# Patient Record
Sex: Male | Born: 1950 | Race: White | Hispanic: No | Marital: Married
Health system: Southern US, Community
[De-identification: ages and names within clinical notes are randomized; demographics above are authoritative.]

## PROBLEM LIST (undated history)

## (undated) DIAGNOSIS — I639 Cerebral infarction, unspecified: Secondary | ICD-10-CM

## (undated) HISTORY — PX: NO PAST SURGERIES: SHX2092

## (undated) HISTORY — DX: Cerebral infarction, unspecified: I63.9

---

## 2014-10-03 ENCOUNTER — Other Ambulatory Visit: Payer: Self-pay

## 2015-08-21 ENCOUNTER — Other Ambulatory Visit: Payer: Self-pay | Admitting: Family Medicine

## 2015-08-22 LAB — CMP12+LP+TP+TSH+6AC+PSA+CBC…
ALT: 25 IU/L (ref 0–44)
AST: 18 IU/L (ref 0–40)
Albumin/Globulin Ratio: 1.7 (ref 1.2–2.2)
Albumin: 4.4 g/dL (ref 3.6–4.8)
Alkaline Phosphatase: 67 IU/L (ref 39–117)
BASOS ABS: 0 10*3/uL (ref 0.0–0.2)
BASOS: 0 %
BILIRUBIN TOTAL: 1.2 mg/dL (ref 0.0–1.2)
BUN/Creatinine Ratio: 16 (ref 10–24)
BUN: 10 mg/dL (ref 8–27)
CHLORIDE: 99 mmol/L (ref 96–106)
CHOL/HDL RATIO: 3.9 ratio (ref 0.0–5.0)
CREATININE: 0.63 mg/dL — AB (ref 0.76–1.27)
Calcium: 8.9 mg/dL (ref 8.6–10.2)
Cholesterol, Total: 167 mg/dL (ref 100–199)
EOS (ABSOLUTE): 0.1 10*3/uL (ref 0.0–0.4)
EOS: 2 %
ESTIMATED CHD RISK: 0.7 times avg. (ref 0.0–1.0)
Free Thyroxine Index: 1.5 (ref 1.2–4.9)
GFR calc Af Amer: 120 mL/min/{1.73_m2} (ref >59)
GFR calc non Af Amer: 104 mL/min/{1.73_m2} (ref >59)
GGT: 21 IU/L (ref 0–65)
GLUCOSE: 95 mg/dL (ref 65–99)
Globulin, Total: 2.6 g/dL (ref 1.5–4.5)
HDL: 43 mg/dL (ref >39)
HEMATOCRIT: 44.5 % (ref 37.5–51.0)
Hemoglobin: 14.7 g/dL (ref 12.6–17.7)
IMMATURE GRANULOCYTES: 0 %
IRON: 120 ug/dL (ref 38–169)
Immature Grans (Abs): 0 10*3/uL (ref 0.0–0.1)
LDH: 160 IU/L (ref 121–224)
LDL Calculated: 103 mg/dL — ABNORMAL HIGH (ref 0–99)
LYMPHS ABS: 2.4 10*3/uL (ref 0.7–3.1)
LYMPHS: 27 %
MCH: 30.2 pg (ref 26.6–33.0)
MCHC: 33 g/dL (ref 31.5–35.7)
MCV: 91 fL (ref 79–97)
MONOCYTES: 9 %
MONOS ABS: 0.8 10*3/uL (ref 0.1–0.9)
Neutrophils Absolute: 5.6 10*3/uL (ref 1.4–7.0)
Neutrophils: 62 %
PHOSPHORUS: 2.5 mg/dL (ref 2.5–4.5)
Platelets: 210 10*3/uL (ref 150–379)
Potassium: 4.3 mmol/L (ref 3.5–5.2)
Prostate Specific Ag, Serum: 1.4 ng/mL (ref 0.0–4.0)
RBC: 4.87 x10E6/uL (ref 4.14–5.80)
RDW: 13.4 % (ref 12.3–15.4)
Sodium: 139 mmol/L (ref 134–144)
T3 UPTAKE RATIO: 25 % (ref 24–39)
T4, Total: 6.1 ug/dL (ref 4.5–12.0)
TOTAL PROTEIN: 7 g/dL (ref 6.0–8.5)
TSH: 1.2 u[IU]/mL (ref 0.450–4.500)
Triglycerides: 106 mg/dL (ref 0–149)
URIC ACID: 6.1 mg/dL (ref 3.7–8.6)
VLDL CHOLESTEROL CAL: 21 mg/dL (ref 5–40)
WBC: 8.9 10*3/uL (ref 3.4–10.8)

## 2015-08-22 LAB — HGB A1C W/O EAG: Hgb A1c MFr Bld: 5.7 % — ABNORMAL HIGH (ref 4.8–5.6)

## 2016-08-07 ENCOUNTER — Ambulatory Visit: Payer: Self-pay | Admitting: *Deleted

## 2016-08-07 VITALS — BP 162/90 | HR 59 | Ht 71.0 in | Wt 256.0 lb

## 2016-08-07 DIAGNOSIS — Z0000 Encounter for general adult medical examination without abnormal findings: Secondary | ICD-10-CM

## 2016-08-07 NOTE — Progress Notes (Signed)
Be Well insurance premium discount evaluation: Labs Drawn. Replacements ROI form signed. Tobacco Free Attestation form signed.  Forms placed in paper chart.  Plan to recheck BP at review appt. No Hx HTN.  No PCP to route results to.

## 2016-08-08 LAB — CMP12+LP+TP+TSH+6AC+PSA+CBC…
A/G RATIO: 2 (ref 1.2–2.2)
ALBUMIN: 4.6 g/dL (ref 3.6–4.8)
ALT: 28 IU/L (ref 0–44)
AST: 18 IU/L (ref 0–40)
Alkaline Phosphatase: 63 IU/L (ref 39–117)
BASOS ABS: 0 10*3/uL (ref 0.0–0.2)
BUN / CREAT RATIO: 15 (ref 10–24)
BUN: 12 mg/dL (ref 8–27)
Basos: 0 %
Bilirubin Total: 1 mg/dL (ref 0.0–1.2)
CHOLESTEROL TOTAL: 147 mg/dL (ref 100–199)
CREATININE: 0.82 mg/dL (ref 0.76–1.27)
Calcium: 9.3 mg/dL (ref 8.6–10.2)
Chloride: 102 mmol/L (ref 96–106)
Chol/HDL Ratio: 3.6 ratio (ref 0.0–5.0)
EOS (ABSOLUTE): 0.2 10*3/uL (ref 0.0–0.4)
Eos: 2 %
Estimated CHD Risk: 0.6 times avg. (ref 0.0–1.0)
FREE THYROXINE INDEX: 1.9 (ref 1.2–4.9)
GFR calc Af Amer: 107 mL/min/{1.73_m2} (ref >59)
GFR calc non Af Amer: 93 mL/min/{1.73_m2} (ref >59)
GGT: 13 IU/L (ref 0–65)
GLOBULIN, TOTAL: 2.3 g/dL (ref 1.5–4.5)
Glucose: 104 mg/dL — ABNORMAL HIGH (ref 65–99)
HDL: 41 mg/dL (ref >39)
Hematocrit: 42.9 % (ref 37.5–51.0)
Hemoglobin: 14.7 g/dL (ref 13.0–17.7)
IMMATURE GRANULOCYTES: 0 %
IRON: 96 ug/dL (ref 38–169)
Immature Grans (Abs): 0 10*3/uL (ref 0.0–0.1)
LDH: 136 IU/L (ref 121–224)
LDL Calculated: 95 mg/dL (ref 0–99)
LYMPHS ABS: 2.5 10*3/uL (ref 0.7–3.1)
Lymphs: 31 %
MCH: 30.8 pg (ref 26.6–33.0)
MCHC: 34.3 g/dL (ref 31.5–35.7)
MCV: 90 fL (ref 79–97)
MONOCYTES: 7 %
Monocytes Absolute: 0.6 10*3/uL (ref 0.1–0.9)
NEUTROS PCT: 60 %
Neutrophils Absolute: 4.8 10*3/uL (ref 1.4–7.0)
PHOSPHORUS: 2.9 mg/dL (ref 2.5–4.5)
PLATELETS: 210 10*3/uL (ref 150–379)
PROSTATE SPECIFIC AG, SERUM: 1.5 ng/mL (ref 0.0–4.0)
Potassium: 4.9 mmol/L (ref 3.5–5.2)
RBC: 4.78 x10E6/uL (ref 4.14–5.80)
RDW: 13.5 % (ref 12.3–15.4)
Sodium: 141 mmol/L (ref 134–144)
T3 UPTAKE RATIO: 26 % (ref 24–39)
T4 TOTAL: 7.3 ug/dL (ref 4.5–12.0)
TOTAL PROTEIN: 6.9 g/dL (ref 6.0–8.5)
TRIGLYCERIDES: 54 mg/dL (ref 0–149)
TSH: 0.961 u[IU]/mL (ref 0.450–4.500)
Uric Acid: 5.1 mg/dL (ref 3.7–8.6)
VLDL CHOLESTEROL CAL: 11 mg/dL (ref 5–40)
WBC: 8.1 10*3/uL (ref 3.4–10.8)

## 2016-08-08 LAB — HGB A1C W/O EAG: Hgb A1c MFr Bld: 5.6 % (ref 4.8–5.6)

## 2016-08-08 NOTE — Progress Notes (Signed)
Results reviewed with pt. A1c slightly improved to WNL range. LDL improved to WNL range. All other labs unremarkable. BP rechecked today 180/100. Pt asymptomatic. Reports he took BP at home twice last night and both were normal ~130's. Requested he come back in one week for a recheck. If still elevated, will give him PCP options as he does not currently have one and does not wish to establish care at this time as he "is not sick." Language barrier prevents in depth conversation. Pt speaks some english, and is able repeat back that labs are normal and to come back to clinic for f/u in one week. Copy provided to pt. Reviewed recommendations for diet and exercise for general health and wt loss as BMI 35. Not further questions/concerns.

## 2016-08-19 ENCOUNTER — Ambulatory Visit: Payer: Self-pay | Admitting: *Deleted

## 2016-08-19 VITALS — BP 164/94

## 2016-08-19 DIAGNOSIS — R030 Elevated blood-pressure reading, without diagnosis of hypertension: Secondary | ICD-10-CM

## 2016-08-19 NOTE — Progress Notes (Signed)
BP recheck after Be Well eval found HTN. Still elevated today. Denies Hx of HTN or s/sx of same. Plans to repeat with RN next week. HTN sx, ER precautions and pcp f/u instructions given.

## 2017-07-23 ENCOUNTER — Telehealth: Payer: Self-pay | Admitting: Registered Nurse

## 2017-07-23 NOTE — Telephone Encounter (Signed)
Please verify with patient if he had annual labs and follow up with PCM due Jun 2019 none noted in Epic

## 2018-03-11 ENCOUNTER — Ambulatory Visit: Payer: Self-pay | Admitting: *Deleted

## 2018-03-11 VITALS — BP 156/93 | HR 65

## 2018-03-11 DIAGNOSIS — Z0000 Encounter for general adult medical examination without abnormal findings: Secondary | ICD-10-CM

## 2018-03-11 NOTE — Progress Notes (Signed)
Pt requests fasting labs be completed. No insurance through company this year, so no Be Well needed but wants labs still. NO pcp established but BP has been trending up for past 3-4 yrs per pt. He sts it is always higher here at work when checked. Encouraged pt to establish primary care for BP surveillance and general health maintenance.

## 2018-03-12 LAB — CMP12+LP+TP+TSH+6AC+PSA+CBC…
ALK PHOS: 81 IU/L (ref 39–117)
ALT: 22 IU/L (ref 0–44)
AST: 15 IU/L (ref 0–40)
Albumin/Globulin Ratio: 1.7 (ref 1.2–2.2)
Albumin: 4.5 g/dL (ref 3.8–4.8)
BILIRUBIN TOTAL: 0.9 mg/dL (ref 0.0–1.2)
BUN/Creatinine Ratio: 18 (ref 10–24)
BUN: 14 mg/dL (ref 8–27)
Basophils Absolute: 0 10*3/uL (ref 0.0–0.2)
Basos: 1 %
CALCIUM: 9.1 mg/dL (ref 8.6–10.2)
CHLORIDE: 99 mmol/L (ref 96–106)
CHOL/HDL RATIO: 4.4 ratio (ref 0.0–5.0)
CHOLESTEROL TOTAL: 174 mg/dL (ref 100–199)
Creatinine, Ser: 0.77 mg/dL (ref 0.76–1.27)
EOS (ABSOLUTE): 0.2 10*3/uL (ref 0.0–0.4)
Eos: 2 %
Estimated CHD Risk: 0.8 times avg. (ref 0.0–1.0)
FREE THYROXINE INDEX: 1.4 (ref 1.2–4.9)
GFR, EST AFRICAN AMERICAN: 109 mL/min/{1.73_m2} (ref >59)
GFR, EST NON AFRICAN AMERICAN: 94 mL/min/{1.73_m2} (ref >59)
GGT: 15 IU/L (ref 0–65)
GLOBULIN, TOTAL: 2.6 g/dL (ref 1.5–4.5)
Glucose: 88 mg/dL (ref 65–99)
HDL: 40 mg/dL (ref >39)
HEMATOCRIT: 42.6 % (ref 37.5–51.0)
HEMOGLOBIN: 14.7 g/dL (ref 13.0–17.7)
IMMATURE GRANS (ABS): 0 10*3/uL (ref 0.0–0.1)
IMMATURE GRANULOCYTES: 0 %
Iron: 95 ug/dL (ref 38–169)
LDH: 172 IU/L (ref 121–224)
LDL CALC: 115 mg/dL — AB (ref 0–99)
LYMPHS: 28 %
Lymphocytes Absolute: 2.4 10*3/uL (ref 0.7–3.1)
MCH: 30.6 pg (ref 26.6–33.0)
MCHC: 34.5 g/dL (ref 31.5–35.7)
MCV: 89 fL (ref 79–97)
MONOS ABS: 0.8 10*3/uL (ref 0.1–0.9)
Monocytes: 9 %
NEUTROS PCT: 60 %
Neutrophils Absolute: 5.4 10*3/uL (ref 1.4–7.0)
PROSTATE SPECIFIC AG, SERUM: 1.8 ng/mL (ref 0.0–4.0)
Phosphorus: 3.2 mg/dL (ref 2.8–4.1)
Platelets: 217 10*3/uL (ref 150–450)
Potassium: 4.2 mmol/L (ref 3.5–5.2)
RBC: 4.81 x10E6/uL (ref 4.14–5.80)
RDW: 12.6 % (ref 11.6–15.4)
Sodium: 138 mmol/L (ref 134–144)
T3 Uptake Ratio: 21 % — ABNORMAL LOW (ref 24–39)
T4, Total: 6.7 ug/dL (ref 4.5–12.0)
TRIGLYCERIDES: 96 mg/dL (ref 0–149)
TSH: 1.3 u[IU]/mL (ref 0.450–4.500)
Total Protein: 7.1 g/dL (ref 6.0–8.5)
Uric Acid: 5.6 mg/dL (ref 3.7–8.6)
VLDL CHOLESTEROL CAL: 19 mg/dL (ref 5–40)
WBC: 8.8 10*3/uL (ref 3.4–10.8)

## 2018-03-12 LAB — HGB A1C W/O EAG: Hgb A1c MFr Bld: 5.7 % — ABNORMAL HIGH (ref 4.8–5.6)

## 2018-03-15 NOTE — Progress Notes (Signed)
noted 

## 2019-03-24 ENCOUNTER — Ambulatory Visit: Payer: Self-pay | Attending: Internal Medicine

## 2019-03-24 DIAGNOSIS — Z23 Encounter for immunization: Secondary | ICD-10-CM

## 2019-03-24 NOTE — Progress Notes (Signed)
   Covid-19 Vaccination Clinic  Name:  Randy Robertson    MRN: [id number] DOB: 02/22/1952  03/24/2019  [name] was observed post Covid-19 immunization for 15 minutes without incident. He was provided with Vaccine Information Sheet and instruction to access the V-Safe system.   [name] was instructed to call 911 with any severe reactions post vaccine: Marland Kitchen Difficulty breathing  . Swelling of face and throat  . A fast heartbeat  . A bad rash all over body  . Dizziness and weakness   Immunizations Administered    Name Date Dose VIS Date Route   Pfizer COVID-19 Vaccine 03/24/2019 10:55 AM 0.3 mL 12/17/2018 Intramuscular   Manufacturer: [employer], [employer]   Lot: [id number]   NDC: [address]-1

## 2019-04-19 ENCOUNTER — Ambulatory Visit: Payer: Self-pay | Attending: Internal Medicine

## 2019-04-19 DIAGNOSIS — Z23 Encounter for immunization: Secondary | ICD-10-CM

## 2019-04-19 NOTE — Progress Notes (Signed)
   Covid-19 Vaccination Clinic  Name:  Randy Robertson    MRN: [id number] DOB: 1950-12-14  04/19/2019  [name] was observed post Covid-19 immunization for 15 minutes without incident. He was provided with Vaccine Information Sheet and instruction to access the V-Safe system.   [name] was instructed to call 911 with any severe reactions post vaccine: Marland Kitchen Difficulty breathing  . Swelling of face and throat  . A fast heartbeat  . A bad rash all over body  . Dizziness and weakness   Immunizations Administered    Name Date Dose VIS Date Route   Pfizer COVID-19 Vaccine 04/19/2019 10:11 AM 0.3 mL 12/17/2018 Intramuscular   Manufacturer: [employer], [employer]   Lot: [id number]   NDC: [address]-1

## 2020-01-30 ENCOUNTER — Ambulatory Visit: Payer: Self-pay

## 2020-02-03 ENCOUNTER — Other Ambulatory Visit (HOSPITAL_BASED_OUTPATIENT_CLINIC_OR_DEPARTMENT_OTHER): Payer: Self-pay | Admitting: Internal Medicine

## 2020-02-03 ENCOUNTER — Ambulatory Visit: Payer: Self-pay | Attending: Internal Medicine

## 2020-02-03 DIAGNOSIS — Z23 Encounter for immunization: Secondary | ICD-10-CM

## 2020-02-03 MED FILL — PFIZER-BIONTECH COVID-19 VA: 30 | 21 days supply | Qty: 0 | Fill #0

## 2020-02-03 NOTE — Progress Notes (Signed)
   Covid-19 Vaccination Clinic  Name:  Randy Robertson    MRN: [id number] DOB: 1950-10-15  02/03/2020  Randy Robertson was observed post Covid-19 immunization for 15 minutes without incident. He was provided with Vaccine Information Sheet and instruction to access the V-Safe system.   Randy Robertson was instructed to call 911 with any severe reactions post vaccine: Marland Kitchen Difficulty breathing  . Swelling of face and throat  . A fast heartbeat  . A bad rash all over body  . Dizziness and weakness   Immunizations Administered    Name Date Dose VIS Date Route   Pfizer COVID-19 Vaccine 02/03/2020 10:51 AM 0.3 mL 10/26/2019 Intramuscular   Manufacturer: [employer], [employer]   Lot: [id number]   NDC: [address]-3

## 2020-04-26 ENCOUNTER — Emergency Department (HOSPITAL_BASED_OUTPATIENT_CLINIC_OR_DEPARTMENT_OTHER): Payer: Medicare HMO

## 2020-04-26 ENCOUNTER — Other Ambulatory Visit: Payer: Self-pay

## 2020-04-26 ENCOUNTER — Inpatient Hospital Stay (HOSPITAL_BASED_OUTPATIENT_CLINIC_OR_DEPARTMENT_OTHER)
Admission: EM | Admit: 2020-04-26 | Discharge: 2020-04-27 | DRG: 066 | Disposition: A | Discharge status: Home / Self Care | Payer: Medicare HMO | Attending: Internal Medicine | Admitting: Internal Medicine

## 2020-04-26 DIAGNOSIS — Z8616 Personal history of COVID-19: Secondary | ICD-10-CM | POA: Diagnosis not present

## 2020-04-26 DIAGNOSIS — R202 Paresthesia of skin: Secondary | ICD-10-CM

## 2020-04-26 DIAGNOSIS — I639 Cerebral infarction, unspecified: Secondary | ICD-10-CM | POA: Diagnosis not present

## 2020-04-26 DIAGNOSIS — R29700 NIHSS score 0: Secondary | ICD-10-CM | POA: Diagnosis not present

## 2020-04-26 DIAGNOSIS — I6389 Other cerebral infarction: Secondary | ICD-10-CM | POA: Diagnosis not present

## 2020-04-26 DIAGNOSIS — R2681 Unsteadiness on feet: Secondary | ICD-10-CM | POA: Diagnosis present

## 2020-04-26 DIAGNOSIS — R7303 Prediabetes: Secondary | ICD-10-CM | POA: Diagnosis present

## 2020-04-26 DIAGNOSIS — Z6836 Body mass index (BMI) 36.0-36.9, adult: Secondary | ICD-10-CM

## 2020-04-26 DIAGNOSIS — E785 Hyperlipidemia, unspecified: Secondary | ICD-10-CM | POA: Diagnosis present

## 2020-04-26 DIAGNOSIS — E669 Obesity, unspecified: Secondary | ICD-10-CM | POA: Diagnosis present

## 2020-04-26 DIAGNOSIS — R030 Elevated blood-pressure reading, without diagnosis of hypertension: Secondary | ICD-10-CM | POA: Diagnosis present

## 2020-04-26 DIAGNOSIS — R200 Anesthesia of skin: Secondary | ICD-10-CM | POA: Diagnosis not present

## 2020-04-26 DIAGNOSIS — H538 Other visual disturbances: Secondary | ICD-10-CM | POA: Diagnosis present

## 2020-04-26 DIAGNOSIS — I63311 Cerebral infarction due to thrombosis of right middle cerebral artery: Secondary | ICD-10-CM

## 2020-04-26 DIAGNOSIS — Z20822 Contact with and (suspected) exposure to covid-19: Secondary | ICD-10-CM | POA: Diagnosis not present

## 2020-04-26 DIAGNOSIS — I63511 Cerebral infarction due to unspecified occlusion or stenosis of right middle cerebral artery: Principal | ICD-10-CM | POA: Diagnosis present

## 2020-04-26 DIAGNOSIS — I10 Essential (primary) hypertension: Secondary | ICD-10-CM | POA: Diagnosis not present

## 2020-04-26 DIAGNOSIS — I63411 Cerebral infarction due to embolism of right middle cerebral artery: Secondary | ICD-10-CM | POA: Diagnosis not present

## 2020-04-26 LAB — URINALYSIS, ROUTINE W REFLEX MICROSCOPIC
Bilirubin Urine: NEGATIVE
Glucose, UA: NEGATIVE mg/dL
Hgb urine dipstick: NEGATIVE
Ketones, ur: NEGATIVE mg/dL
Leukocytes,Ua: NEGATIVE
Nitrite: NEGATIVE
Protein, ur: NEGATIVE mg/dL
Specific Gravity, Urine: <1.005 — ABNORMAL LOW (ref 1.005–1.030)
pH: 7 (ref 5.0–8.0)

## 2020-04-26 LAB — RESP PANEL BY RT-PCR (FLU A&B, COVID) ARPGX2
Influenza A by PCR: NEGATIVE
Influenza B by PCR: NEGATIVE
SARS Coronavirus 2 by RT PCR: NEGATIVE

## 2020-04-26 LAB — COMPREHENSIVE METABOLIC PANEL
ALT: 39 U/L (ref 0–44)
AST: 30 U/L (ref 15–41)
Albumin: 4.4 g/dL (ref 3.5–5.0)
Alkaline Phosphatase: 60 U/L (ref 38–126)
Anion gap: 10 (ref 5–15)
BUN: 10 mg/dL (ref 8–23)
CO2: 26 mmol/L (ref 22–32)
Calcium: 8.9 mg/dL (ref 8.9–10.3)
Chloride: 100 mmol/L (ref 98–111)
Creatinine, Ser: 0.72 mg/dL (ref 0.61–1.24)
GFR, Estimated: >60 mL/min (ref >60)
Glucose, Bld: 96 mg/dL (ref 70–99)
Potassium: 3.7 mmol/L (ref 3.5–5.1)
Sodium: 136 mmol/L (ref 135–145)
Total Bilirubin: 0.9 mg/dL (ref 0.3–1.2)
Total Protein: 7.4 g/dL (ref 6.5–8.1)

## 2020-04-26 LAB — CBC
HCT: 43.2 % (ref 39.0–52.0)
Hemoglobin: 15 g/dL (ref 13.0–17.0)
MCH: 31 pg (ref 26.0–34.0)
MCHC: 34.7 g/dL (ref 30.0–36.0)
MCV: 89.3 fL (ref 80.0–100.0)
Platelets: 219 10*3/uL (ref 150–400)
RBC: 4.84 MIL/uL (ref 4.22–5.81)
RDW: 12.4 % (ref 11.5–15.5)
WBC: 10.5 10*3/uL (ref 4.0–10.5)
nRBC: 0 % (ref 0.0–0.2)

## 2020-04-26 LAB — DIFFERENTIAL
Abs Immature Granulocytes: 0.04 10*3/uL (ref 0.00–0.07)
Basophils Absolute: 0 10*3/uL (ref 0.0–0.1)
Basophils Relative: 0 %
Eosinophils Absolute: 0.1 10*3/uL (ref 0.0–0.5)
Eosinophils Relative: 1 %
Immature Granulocytes: 0 %
Lymphocytes Relative: 24 %
Lymphs Abs: 2.5 10*3/uL (ref 0.7–4.0)
Monocytes Absolute: 1 10*3/uL (ref 0.1–1.0)
Monocytes Relative: 9 %
Neutro Abs: 6.9 10*3/uL (ref 1.7–7.7)
Neutrophils Relative %: 66 %

## 2020-04-26 LAB — RAPID URINE DRUG SCREEN, HOSP PERFORMED
Amphetamines: NOT DETECTED
Barbiturates: NOT DETECTED
Benzodiazepines: NOT DETECTED
Cocaine: NOT DETECTED
Opiates: NOT DETECTED
Tetrahydrocannabinol: NOT DETECTED

## 2020-04-26 LAB — APTT: aPTT: 27 seconds (ref 24–36)

## 2020-04-26 LAB — ETHANOL: Alcohol, Ethyl (B): <10 mg/dL (ref <10)

## 2020-04-26 LAB — PROTIME-INR
INR: 1 (ref 0.8–1.2)
Prothrombin Time: 13 seconds (ref 11.4–15.2)

## 2020-04-26 MED ORDER — IOHEXOL 350 MG/ML SOLN
100.0000 mL | Freq: Once | INTRAVENOUS | Status: AC
  Administered 2020-04-26: 100 mL via INTRAVENOUS

## 2020-04-26 NOTE — ED Notes (Signed)
Urine obtained, family at bedside, no c/o pain, vss Green top tube re-drawn and sent to lab

## 2020-04-26 NOTE — ED Notes (Signed)
REPORT CALLED TO [facility] CHARGE NURSE CALLIE REPORT GIVEN TO HAZEL WITH Melburn Hake

## 2020-04-26 NOTE — ED Triage Notes (Signed)
Daughter reports c/o left arm numbness from elbow to left fingers starting this am at 0830.blurred vision at 3 pm.  Daughter also reports unstable gait today, last known normal . Unknown. , pt denies h/a

## 2020-04-26 NOTE — ED Notes (Signed)
Family states that approx 0800hrs today pt began to complain of numbness in his left arm, felt like his balance was off, also states he is having urinary frequency, denies ever having a HA or vision issues.

## 2020-04-26 NOTE — ED Notes (Signed)
PT LAST NORMAL 0800HRS TODAY

## 2020-04-26 NOTE — ED Triage Notes (Signed)
Coming from [facility] ED for MRI after evaluated for possible stroke.

## 2020-04-26 NOTE — ED Notes (Signed)
ED Provider at bedside. 

## 2020-04-26 NOTE — ED Provider Notes (Signed)
[facility] EMERGENCY DEPARTMENT Provider Note   CSN: [id number] Arrival date & time: 04/26/20  1831     History Chief Complaint  Patient presents with  . Numbness    Randy Robertson is a 70 y.o. male.  He is [place] and daughter is providing history.  He woke up with numbness left arm from his elbow to his fingers.  He has felt somewhat unsteady with his gait since then.  At 3 PM he started noticing he was also having some blurred vision.  The history is provided by the patient and a relative. The history is limited by a language barrier. A language interpreter was used (daughter at patient request).  Cerebrovascular Accident This is a new problem. The current episode started 12 to 24 hours ago. The problem occurs constantly. The problem has not changed since onset.Pertinent negatives include no chest pain, no abdominal pain, no headaches and no shortness of breath. Nothing aggravates the symptoms. Nothing relieves the symptoms. He has tried nothing for the symptoms. The treatment provided no relief.       History reviewed. No pertinent past medical history.  Patient Active Problem List   Diagnosis Date Noted  . Stroke (HCC) 04/27/2020  . Elevated blood-pressure reading without diagnosis of hypertension 04/27/2020    Past Surgical History:  Procedure Laterality Date  . NO PAST SURGERIES         History reviewed. No pertinent family history.     Home Medications Prior to Admission medications   Not on File    Allergies    Patient has no known allergies.  Review of Systems   Review of Systems  Constitutional: Negative for fever.  HENT: Negative for sore throat.   Eyes: Positive for visual disturbance.  Respiratory: Negative for shortness of breath.   Cardiovascular: Negative for chest pain.  Gastrointestinal: Negative for abdominal pain.  Genitourinary: Positive for frequency. Negative for dysuria.  Musculoskeletal: Positive for gait  problem. Negative for neck pain.  Skin: Negative for rash.  Neurological: Positive for weakness and numbness. Negative for speech difficulty and headaches.    Physical Exam Updated Vital Signs BP (!) 171/78 (BP Location: Right Arm)   Pulse 77   Temp 98.6 F (37 C) (Oral)   Resp 20   Wt 117.9 kg   SpO2 96%   BMI 36.26 kg/m   Physical Exam Vitals and nursing note reviewed.  Constitutional:      Appearance: Normal appearance. He is well-developed.  HENT:     Head: Normocephalic and atraumatic.  Eyes:     Conjunctiva/sclera: Conjunctivae normal.  Cardiovascular:     Rate and Rhythm: Normal rate and regular rhythm.     Heart sounds: No murmur Robertson.   Pulmonary:     Effort: Pulmonary effort is normal. No respiratory distress.     Breath sounds: Normal breath sounds.  Abdominal:     Palpations: Abdomen is soft.     Tenderness: There is no abdominal tenderness. There is no guarding or rebound.  Musculoskeletal:        General: No deformity or signs of injury. Normal range of motion.     Cervical back: Neck supple.  Skin:    General: Skin is warm and dry.  Neurological:     Mental Status: He is alert.     Cranial Nerves: No cranial nerve deficit.     Sensory: Sensory deficit present.     Motor: No weakness.     Comments: No  obvious facial asymmetry and clear speech.  Subjective decrease sensation and some subtle weakness in his left arm.  Normal strength lower extremities.  Gait deferred.     ED Results / Procedures / Treatments   Labs (all labs ordered are listed, but only abnormal results are displayed) Labs Reviewed  URINALYSIS, ROUTINE W REFLEX MICROSCOPIC - Abnormal; Notable for the following components:      Result Value   Specific Gravity, Urine <1.005 (*)    All other components within normal limits  LIPID PANEL - Abnormal; Notable for the following components:   LDL Cholesterol 132 (*)    All other components within normal limits  RESP PANEL BY RT-PCR  (FLU A&B, COVID) ARPGX2  SARS CORONAVIRUS 2 (TAT 6-24 HRS)  ETHANOL  PROTIME-INR  APTT  CBC  DIFFERENTIAL  COMPREHENSIVE METABOLIC PANEL  RAPID URINE DRUG SCREEN, HOSP PERFORMED  HIV ANTIBODY (ROUTINE TESTING W REFLEX)  HEMOGLOBIN A1C    EKG None  Radiology CT Angio Head W/Cm &/Or Wo Cm  Result Date: 04/26/2020 CLINICAL DATA:  Neuro deficit, acute, stroke suspected. Additional history provided: Patient reports left arm numbness from elbow to left fingers starting this morning, blurred vision, patient's daughter reports unstable gait, unknown last normal. EXAM: CT ANGIOGRAPHY HEAD AND NECK TECHNIQUE: Multidetector CT imaging of the head and neck was performed using the standard protocol during bolus administration of intravenous contrast. Multiplanar CT image reconstructions and MIPs were obtained to evaluate the vascular anatomy. Carotid stenosis measurements (when applicable) are obtained utilizing NASCET criteria, using the distal internal carotid diameter as the denominator. CONTRAST:  OMNIPAQUE IOHEXOL 350 MG/ML SOLN COMPARISON:  No pertinent prior exams available for comparison. FINDINGS: CT HEAD FINDINGS Brain: Mild cerebral atrophy. Mild ill-defined hypoattenuation within the cerebral white matter is nonspecific, but compatible with chronic small vessel ischemic disease. A small acute cortical/subcortical infarct (measuring approximately 10 mm) is questioned within the mid to posterior right frontal lobe (for instance as seen on series 4, image 25) (series 8, images 49 and 50). There is no acute intracranial hemorrhage. No extra-axial fluid collection. No evidence of intracranial mass. No midline shift. Vascular: No hyperdense vessel.  Atherosclerotic calcifications Skull: Normal. Negative for fracture or focal lesion. Sinuses: No significant paranasal sinus disease at the imaged levels. Orbits: No mass or acute finding Review of the MIP images confirms the above findings CTA NECK  FINDINGS Aortic arch: Standard aortic branching. Atherosclerotic plaque within the visualized aortic arch and proximal major branch vessels of the neck. No hemodynamically significant innominate or proximal subclavian artery stenosis. Right carotid system: CCA and ICA patent within the neck. Mild soft and calcified plaque within the carotid bifurcation and proximal ICA. Resultant less than 50% stenosis within the proximal ICA. Tortuosity of the cervical ICA. Left carotid system: CCA and ICA patent within the neck without stenosis. Mild soft and calcified plaque within the carotid bifurcation and proximal ICA. Vertebral arteries: Right vertebral artery slightly dominant. Mild nonstenotic calcified plaque at the origin of the right vertebral artery. Mild atheromatous narrowing at the origin of the left vertebral artery. Skeleton: Cervical spondylosis. No acute bony abnormality or aggressive osseous lesion. Other neck: No neck mass or cervical lymphadenopathy. Thyroid unremarkable. Upper chest: No consolidation within the imaged lung apices. Review of the MIP images confirms the above findings CTA HEAD FINDINGS Anterior circulation: The intracranial internal carotid arteries are patent. The M1 middle cerebral arteries are patent. No M2 proximal branch occlusion or high-grade proximal stenosis is identified. The  A1 left anterior cerebral artery is developmentally hypoplastic or absent. The anterior cerebral arteries are otherwise patent. No intracranial aneurysm is identified. Posterior circulation: The intracranial vertebral arteries are patent. The basilar artery is patent. The posterior cerebral arteries are patent. Atherosclerotic irregularity of the posterior cerebral arteries bilaterally. Most notably, there is a severe stenosis within the proximal left PCA at the P1/P2 junction (series 15, image 53). Posterior communicating arteries are hypoplastic or absent bilaterally. Venous sinuses: Within the limitations of  contrast timing, no convincing thrombus. Anatomic variants: Posterior communicating arteries are hypoplastic or absent bilaterally. Review of the MIP images confirms the above findings IMPRESSION: CT head: 1. Small acute cortically-based infarct questioned within the mid to posterior right frontal lobe (measuring approximately 10 mm). Consider brain MRI for further evaluation. 2. Mild cerebral atrophy and chronic small vessel ischemic disease. CTA neck: 1. The common carotid and internal carotid arteries are patent within the neck without hemodynamically significant stenosis. Mild atherosclerotic plaque within the carotid bifurcations and proximal ICAs, bilaterally. 2. Vertebral arteries patent within the neck. Mild atherosclerotic narrowing at the origin of both vessels. CTA head: 1. No intracranial large vessel occlusion. 2. 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 3. Calcified plaque within the intracranial internal carotid arteries with sites of mild stenosis, bilaterally. Electronically Signed   By: Jackey Loge DO   On: 04/26/2020 20:42   CT Angio Neck W and/or Wo Contrast  Result Date: 04/26/2020 CLINICAL DATA:  Neuro deficit, acute, stroke suspected. Additional history provided: Patient reports left arm numbness from elbow to left fingers starting this morning, blurred vision, patient's daughter reports unstable gait, unknown last normal. EXAM: CT ANGIOGRAPHY HEAD AND NECK TECHNIQUE: Multidetector CT imaging of the head and neck was performed using the standard protocol during bolus administration of intravenous contrast. Multiplanar CT image reconstructions and MIPs were obtained to evaluate the vascular anatomy. Carotid stenosis measurements (when applicable) are obtained utilizing NASCET criteria, using the distal internal carotid diameter as the denominator. CONTRAST:  OMNIPAQUE IOHEXOL 350 MG/ML SOLN COMPARISON:  No pertinent prior exams  available for comparison. FINDINGS: CT HEAD FINDINGS Brain: Mild cerebral atrophy. Mild ill-defined hypoattenuation within the cerebral white matter is nonspecific, but compatible with chronic small vessel ischemic disease. A small acute cortical/subcortical infarct (measuring approximately 10 mm) is questioned within the mid to posterior right frontal lobe (for instance as seen on series 4, image 25) (series 8, images 49 and 50). There is no acute intracranial hemorrhage. No extra-axial fluid collection. No evidence of intracranial mass. No midline shift. Vascular: No hyperdense vessel.  Atherosclerotic calcifications Skull: Normal. Negative for fracture or focal lesion. Sinuses: No significant paranasal sinus disease at the imaged levels. Orbits: No mass or acute finding Review of the MIP images confirms the above findings CTA NECK FINDINGS Aortic arch: Standard aortic branching. Atherosclerotic plaque within the visualized aortic arch and proximal major branch vessels of the neck. No hemodynamically significant innominate or proximal subclavian artery stenosis. Right carotid system: CCA and ICA patent within the neck. Mild soft and calcified plaque within the carotid bifurcation and proximal ICA. Resultant less than 50% stenosis within the proximal ICA. Tortuosity of the cervical ICA. Left carotid system: CCA and ICA patent within the neck without stenosis. Mild soft and calcified plaque within the carotid bifurcation and proximal ICA. Vertebral arteries: Right vertebral artery slightly dominant. Mild nonstenotic calcified plaque at the origin of the right vertebral artery. Mild atheromatous narrowing at the  origin of the left vertebral artery. Skeleton: Cervical spondylosis. No acute bony abnormality or aggressive osseous lesion. Other neck: No neck mass or cervical lymphadenopathy. Thyroid unremarkable. Upper chest: No consolidation within the imaged lung apices. Review of the MIP images confirms the above  findings CTA HEAD FINDINGS Anterior circulation: The intracranial internal carotid arteries are patent. The M1 middle cerebral arteries are patent. No M2 proximal branch occlusion or high-grade proximal stenosis is identified. The A1 left anterior cerebral artery is developmentally hypoplastic or absent. The anterior cerebral arteries are otherwise patent. No intracranial aneurysm is identified. Posterior circulation: The intracranial vertebral arteries are patent. The basilar artery is patent. The posterior cerebral arteries are patent. Atherosclerotic irregularity of the posterior cerebral arteries bilaterally. Most notably, there is a severe stenosis within the proximal left PCA at the P1/P2 junction (series 15, image 53). Posterior communicating arteries are hypoplastic or absent bilaterally. Venous sinuses: Within the limitations of contrast timing, no convincing thrombus. Anatomic variants: Posterior communicating arteries are hypoplastic or absent bilaterally. Review of the MIP images confirms the above findings IMPRESSION: CT head: 1. Small acute cortically-based infarct questioned within the mid to posterior right frontal lobe (measuring approximately 10 mm). Consider brain MRI for further evaluation. 2. Mild cerebral atrophy and chronic small vessel ischemic disease. CTA neck: 1. The common carotid and internal carotid arteries are patent within the neck without hemodynamically significant stenosis. Mild atherosclerotic plaque within the carotid bifurcations and proximal ICAs, bilaterally. 2. Vertebral arteries patent within the neck. Mild atherosclerotic narrowing at the origin of both vessels. CTA head: 1. No intracranial large vessel occlusion. 2. 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 3. Calcified plaque within the intracranial internal carotid arteries with sites of mild stenosis, bilaterally. Electronically Signed   By: Jackey Loge DO   On:  04/26/2020 20:42   MR BRAIN WO CONTRAST  Result Date: 04/27/2020 CLINICAL DATA:  Left arm numbness EXAM: MRI HEAD WITHOUT CONTRAST TECHNIQUE: Multiplanar, multiecho pulse sequences of the brain and surrounding structures were obtained without intravenous contrast. COMPARISON:  None. FINDINGS: Brain: Multifocal abnormal diffusion restriction within the right MCA territory. No acute or chronic hemorrhage. There is multifocal hyperintense T2-weighted signal within the white matter. Parenchymal volume and CSF spaces are normal. The midline structures are normal. Vascular: Major flow voids are preserved. Skull and upper cervical spine: Normal calvarium and skull base. Visualized upper cervical spine and soft tissues are normal. Sinuses/Orbits:No paranasal sinus fluid levels or advanced mucosal thickening. No mastoid or middle ear effusion. Normal orbits. IMPRESSION: Multifocal acute ischemia within the right MCA territory. No hemorrhage or mass effect. Electronically Signed   By: Deatra Robinson M.D.   On: 04/27/2020 00:52    Procedures Procedures   Medications Ordered in ED Medications  aspirin chewable tablet 81 mg (81 mg Oral Given 04/27/20 0141)  atorvastatin (LIPITOR) tablet 40 mg (has no administration in time range)   stroke: mapping our early stages of recovery book (has no administration in time range)  0.9 %  sodium chloride infusion (has no administration in time range)  acetaminophen (TYLENOL) tablet 650 mg (has no administration in time range)    Or  acetaminophen (TYLENOL) 160 MG/5ML solution 650 mg (has no administration in time range)    Or  acetaminophen (TYLENOL) suppository 650 mg (has no administration in time range)  senna-docusate (Senokot-S) tablet 1 tablet (has no administration in time range)  iohexol (OMNIPAQUE) 350 MG/ML injection 100 mL (100 mLs  Intravenous Contrast Given 04/26/20 1940)    ED Course  I have reviewed the triage vital signs and the nursing  notes.  Pertinent labs & imaging results that were available during my care of the patient were reviewed by me and considered in my medical decision making (see chart for details).  Clinical Course as of 04/27/20 0936  Thu Apr 26, 2020  8299 Discussed with Dr. Jerrell Belfast.  He said he needs a CT angio head and neck and then she be transferred to the [facility] ED where he get an MRI and neuro consult and make a decision for final disposition. [MB]  1858 I reviewed the results of the CAT scan and his lab work with patient and family member.  I also reviewed neurology's recommendation for an MRI.  They are in agreement for transfer to [facility] to get an MRI.  Discussed with Dr. Rodena Medin ED physician who accepts the patient in transfer. [MB]  1901 ECG is showing normal sinus rhythm rate of 73 right bundle branch block left anterior fascicular block, no acute ST-T's. [MB]    Clinical Course User Index [MB] Terrilee Files, MD   MDM Rules/Calculators/A&P                         This patient complains of left arm numbness and weakness, blurry vision, unsteady gait; this involves an extensive number of treatment Options and is a complaint that carries with it a high risk of complications and Morbidity. The differential includes stroke, TIA, hypertensive urgency emergency, metabolic derangement, arrhythmia  I ordered, reviewed and interpreted labs, which included CBC with normal white count normal hemoglobin, chemistries normal, LFTs normal, coags normal, COVID testing negative I ordered imaging studies which included CTA head and neck and I independently    visualized and interpreted imaging which showed possible stroke Additional history obtained from patient's daughter Previous records obtained and reviewed in epic no acute findings I consulted Dr. Wilford Corner neurology and Dr. Rodena Medin ED physician and discussed lab and imaging findings  Critical Interventions: None  After the interventions stated above, I  reevaluated the patient and found patient still to be symptomatic although no progression of symptoms.  Explained rationale for transfer to [facility] for MRI and further work-up.  He is in agreement with transfer.   Final Clinical Impression(s) / ED Diagnoses Final diagnoses:  Paresthesia  Unsteady gait  Blurry vision  Cerebrovascular accident (CVA), unspecified mechanism (HCC)    Rx / DC Orders ED Discharge Orders    None       Terrilee Files, MD 04/27/20 [phone]

## 2020-04-26 NOTE — ED Notes (Signed)
[facility] TEAM AT BEDSIDE 

## 2020-04-27 ENCOUNTER — Emergency Department (HOSPITAL_COMMUNITY): Payer: Medicare HMO

## 2020-04-27 ENCOUNTER — Encounter (HOSPITAL_COMMUNITY): Payer: Self-pay | Admitting: Family Medicine

## 2020-04-27 ENCOUNTER — Inpatient Hospital Stay (HOSPITAL_COMMUNITY): Payer: Medicare HMO

## 2020-04-27 DIAGNOSIS — Z8616 Personal history of COVID-19: Secondary | ICD-10-CM | POA: Diagnosis not present

## 2020-04-27 DIAGNOSIS — I6389 Other cerebral infarction: Secondary | ICD-10-CM

## 2020-04-27 DIAGNOSIS — R030 Elevated blood-pressure reading, without diagnosis of hypertension: Secondary | ICD-10-CM

## 2020-04-27 DIAGNOSIS — R200 Anesthesia of skin: Secondary | ICD-10-CM | POA: Diagnosis not present

## 2020-04-27 DIAGNOSIS — R29700 NIHSS score 0: Secondary | ICD-10-CM | POA: Diagnosis not present

## 2020-04-27 DIAGNOSIS — I63511 Cerebral infarction due to unspecified occlusion or stenosis of right middle cerebral artery: Secondary | ICD-10-CM | POA: Diagnosis present

## 2020-04-27 DIAGNOSIS — I639 Cerebral infarction, unspecified: Secondary | ICD-10-CM | POA: Diagnosis present

## 2020-04-27 DIAGNOSIS — R7303 Prediabetes: Secondary | ICD-10-CM | POA: Diagnosis present

## 2020-04-27 DIAGNOSIS — E669 Obesity, unspecified: Secondary | ICD-10-CM | POA: Diagnosis present

## 2020-04-27 DIAGNOSIS — R202 Paresthesia of skin: Secondary | ICD-10-CM | POA: Diagnosis not present

## 2020-04-27 DIAGNOSIS — Z6836 Body mass index (BMI) 36.0-36.9, adult: Secondary | ICD-10-CM | POA: Diagnosis not present

## 2020-04-27 DIAGNOSIS — I10 Essential (primary) hypertension: Secondary | ICD-10-CM | POA: Diagnosis not present

## 2020-04-27 DIAGNOSIS — R2681 Unsteadiness on feet: Secondary | ICD-10-CM | POA: Diagnosis present

## 2020-04-27 DIAGNOSIS — I63411 Cerebral infarction due to embolism of right middle cerebral artery: Secondary | ICD-10-CM

## 2020-04-27 DIAGNOSIS — Z20822 Contact with and (suspected) exposure to covid-19: Secondary | ICD-10-CM | POA: Diagnosis not present

## 2020-04-27 DIAGNOSIS — E785 Hyperlipidemia, unspecified: Secondary | ICD-10-CM | POA: Diagnosis present

## 2020-04-27 DIAGNOSIS — H538 Other visual disturbances: Secondary | ICD-10-CM | POA: Diagnosis present

## 2020-04-27 LAB — LIPID PANEL
Cholesterol: 194 mg/dL (ref 0–200)
HDL: 42 mg/dL (ref >40)
LDL Cholesterol: 132 mg/dL — ABNORMAL HIGH (ref 0–99)
Total CHOL/HDL Ratio: 4.6 RATIO
Triglycerides: 99 mg/dL (ref <150)
VLDL: 20 mg/dL (ref 0–40)

## 2020-04-27 LAB — ECHOCARDIOGRAM COMPLETE
AR max vel: 3.14 cm2
AV Area VTI: 3.4 cm2
AV Area mean vel: 3.26 cm2
AV Mean grad: 4 mmHg
AV Peak grad: 9.5 mmHg
Ao pk vel: 1.54 m/s
Area-P 1/2: 2.73 cm2
Calc EF: 56.4 %
S' Lateral: 3.7 cm
Single Plane A2C EF: 55.5 %
Single Plane A4C EF: 56.8 %
Weight: 4176.39 oz

## 2020-04-27 LAB — HIV ANTIBODY (ROUTINE TESTING W REFLEX): HIV Screen 4th Generation wRfx: NONREACTIVE

## 2020-04-27 MED ORDER — ASPIRIN 81 MG PO CHEW: 81.0000 mg | CHEWABLE_TABLET | Freq: Every day | ORAL | 0 refills | Status: AC

## 2020-04-27 MED ORDER — STROKE: EARLY STAGES OF RECOVERY BOOK: Freq: Once | Status: DC

## 2020-04-27 MED ORDER — SENNOSIDES-DOCUSATE SODIUM 8.6-50 MG PO TABS: 1.0000 | ORAL_TABLET | Freq: Every evening | ORAL | Status: DC | PRN

## 2020-04-27 MED ORDER — SODIUM CHLORIDE 0.9 % IV SOLN: INTRAVENOUS | Status: DC

## 2020-04-27 MED ORDER — CLOPIDOGREL BISULFATE 75 MG PO TABS: 75.0000 mg | ORAL_TABLET | Freq: Every day | ORAL | 0 refills | Status: AC

## 2020-04-27 MED ORDER — ACETAMINOPHEN 160 MG/5ML PO SOLN: 650.0000 mg | ORAL | Status: DC | PRN

## 2020-04-27 MED ORDER — CLOPIDOGREL BISULFATE 75 MG PO TABS
75.0000 mg | ORAL_TABLET | Freq: Every day | ORAL | Status: DC
  Administered 2020-04-27: 75 mg via ORAL
  Filled 2020-04-27: qty 1

## 2020-04-27 MED ORDER — ACETAMINOPHEN 325 MG PO TABS: 650.0000 mg | ORAL_TABLET | ORAL | Status: DC | PRN

## 2020-04-27 MED ORDER — ATORVASTATIN CALCIUM 40 MG PO TABS
40.0000 mg | ORAL_TABLET | Freq: Every day | ORAL | 0 refills | Status: DC
Start: 2020-04-28 — End: 2020-06-26

## 2020-04-27 MED ORDER — ASPIRIN 81 MG PO CHEW
81.0000 mg | CHEWABLE_TABLET | Freq: Every day | ORAL | Status: DC
  Administered 2020-04-27: 81 mg via ORAL
  Filled 2020-04-27 (×2): qty 1

## 2020-04-27 MED ORDER — ACETAMINOPHEN 650 MG RE SUPP: 650.0000 mg | RECTAL | Status: DC | PRN

## 2020-04-27 MED ORDER — ATORVASTATIN CALCIUM 10 MG PO TABS
40.0000 mg | ORAL_TABLET | Freq: Every day | ORAL | Status: DC
  Administered 2020-04-27: 40 mg via ORAL
  Filled 2020-04-27: qty 1

## 2020-04-27 NOTE — ED Notes (Signed)
ED Provider at bedside to discuss MRI results.

## 2020-04-27 NOTE — Progress Notes (Signed)
TOC CM contacted [employer] for delivery of DME to pt's room today, RW, 3n1 bedside commode and shower chair. Spoke to dtr, [place] and she is aware she will need to pay for shower chair if not covered by insurance. Isidoro Donning RN CCM, [facility] ED TOC CM [phone]

## 2020-04-27 NOTE — ED Notes (Signed)
Pt ambulatory to restroom with stan-by assist. Pt moves slowly and is slightly unsteady but able to get up and get to toilet with out assist

## 2020-04-27 NOTE — ED Notes (Signed)
Patient transported to MRI 

## 2020-04-27 NOTE — Discharge Instructions (Signed)
Take both aspirin and plavix for 3 weeks, then afterwards take only aspirin daily  Please follow up with Cardiology as an outpatient as will be scheduled

## 2020-04-27 NOTE — H&P (Signed)
History and Physical    Randy Robertson PFX:[id number] DOB: [birth year] DOA: 04/26/2020  PCP: Pcp, No   Patient coming from:  Home via [facility] ER  Chief Complaint: Left arm weakness and unsteady gait.   HPI: Randy Robertson is a 70 y.o. male with no significant past medical history who presents from [facility] ER for stroke work-up.  Patient speaks [place] and his daughter is at the bedside and interprets for him.  Patient states he does not want another interpreter except for his daughter.  Reportedly patient was in his normal state of health until the morning of April 26, 2020 when he noticed some left arm numbness and weakness from his elbow to his fingers.  He also had some unsteady gait according to his daughter but he did not fall.  Around 3:00 in the afternoon he developed some blurry vision and decided to go to the emergency room at [facility] for evaluation.  CT angiography of the head neck was obtained which showed possible area of stroke in the right side of the brain and he was sent to [facility] ER for MRI.  MRI was positive for stroke in the right MCA territory.  Patient denies any chronic medical problems and does not take any medications on a regular basis at home.  He denies any chest pain, palpitations, shortness of breath, syncope, nausea, vomiting, diarrhea.  He denies any recent fever or illness.  Denies any recent travel.  He is unsure of his cholesterol level and does not check his blood pressure regularly at home. He did have a COVID-vaccine in January 2022  ED Course: Mr. Monaco is medically stable in the emergency room.  Blood pressure has been mildly elevated in the 150-170/80-95 range.  Blood work is unremarkable. MRI of the brain shows multifocal strokes in the right MCA territory. Hospitalist service has been asked to admit for further management. Neurology, Dr. Otelia Limes, has been consulted   Review of Systems:  General: Reports left arm weakness weakness and unsteady gait.  Deneis fever, chills, weight loss, night sweats.  Denies change in appetite HENT: Denies head trauma, headache, denies change in hearing, tinnitus. Denies nasal congestion or bleeding.  Denies sore throat, sores in mouth.  Denies difficulty swallowing Eyes: Denies pain in eye, drainage.  Denies discoloration of eyes. Neck: Denies pain.  Denies swelling.  Denies pain with movement. Cardiovascular: Denies chest pain, palpitations.  Denies edema.  Denies orthopnea Respiratory: Denies shortness of breath, cough.  Denies wheezing.  Denies sputum production Gastrointestinal: Denies abdominal pain, swelling.  Denies nausea, vomiting, diarrhea.  Denies melena.  Denies hematemesis. Musculoskeletal: Denies limitation of movement.  Denies deformity or swelling.  Denies pain.  Denies arthralgias or myalgias. Genitourinary: Denies pelvic pain.  Denies urinary frequency or hesitancy.  Denies dysuria.  Skin: Denies rash.  Denies petechiae, purpura, ecchymosis. Neurological: Denies headache. Denies syncope.  Denies seizure activity. Denies slurred speech, drooping face. Psychiatric: Denies depression, anxiety. Denies hallucinations.  History reviewed. No pertinent past medical history.  Past Surgical History:  Procedure Laterality Date  . NO PAST SURGERIES      Social History  reports that he has never smoked. He has never used smokeless tobacco. He reports that he does not drink alcohol and does not use drugs.  No Known Allergies  History reviewed. No pertinent family history.   Prior to Admission medications   Medication Sig Start Date End Date Taking? Authorizing Provider  COVID-19 mRNA vaccine, Pfizer, 30 MCG/0.3ML injection INJECT AS DIRECTED  02/03/20 02/02/21  Judyann MunsonSnider, Cynthia, MD    Physical Exam: Vitals:   04/26/20 2248 04/26/20 2354 04/27/20 0102 04/27/20 0133  BP: (!) 158/79 (!) 160/87 (!) 165/90 (!) 168/88  Pulse: 71 70 67 71  Resp: 14 14 14    Temp:   98.2 F (36.8 C) 98.2 F (36.8  C)  TempSrc:   Oral Oral  SpO2: 97% 100% 96% 95%  Weight:        Constitutional: NAD, calm, comfortable Vitals:   04/26/20 2248 04/26/20 2354 04/27/20 0102 04/27/20 0133  BP: (!) 158/79 (!) 160/87 (!) 165/90 (!) 168/88  Pulse: 71 70 67 71  Resp: 14 14 14    Temp:   98.2 F (36.8 C) 98.2 F (36.8 C)  TempSrc:   Oral Oral  SpO2: 97% 100% 96% 95%  Weight:       General: WDWN, Alert and oriented x3.  Eyes: EOMI, PERRL,  conjunctivae normal.  Sclera nonicteric HENT:  [place]/AT, external ears normal.  Nares patent without epistasis.  Mucous membranes are moist. Posterior pharynx clear of any exudate or lesions..  Neck: Soft, normal range of motion, supple, no masses, no thyromegaly. Trachea midline Respiratory: clear to auscultation bilaterally, no wheezing, no crackles. Normal respiratory effort. No accessory muscle use.  Cardiovascular: Regular rate and rhythm, no murmurs / rubs / gallops. No extremity edema. 2+ pedal pulses. No carotid bruits.  Abdomen: Soft, no tenderness, nondistended, no rebound or guarding.  Obese.  No masses palpated. Bowel sounds normoactive Musculoskeletal: FROM. No cyanosis. No joint deformity upper and lower extremities. Normal muscle tone.  Skin: Warm, dry, intact no rashes, lesions, ulcers. No induration Neurologic: CN 2-12 grossly intact. Normal speech. Sensation intact, patella DTR +1 bilaterally. Strength 4/5 in left upper extremity, strength 5/5 in right upper and both lower extremities.  Normal finger-nose.  Negative pronator drift Psychiatric: Normal judgment and insight.  Normal mood.    Labs on Admission: I have personally reviewed following labs and imaging studies  CBC: Recent Labs  Lab 04/26/20 1900  WBC 10.5  NEUTROABS 6.9  HGB 15.0  HCT 43.2  MCV 89.3  PLT 219    Basic Metabolic Panel: Recent Labs  Lab 04/26/20 1900  NA 136  K 3.7  CL 100  CO2 26  GLUCOSE 96  BUN 10  CREATININE 0.72  CALCIUM 8.9    GFR: CrCl cannot be  calculated (Unknown ideal weight.).  Liver Function Tests: Recent Labs  Lab 04/26/20 1900  AST 30  ALT 39  ALKPHOS 60  BILITOT 0.9  PROT 7.4  ALBUMIN 4.4    Urine analysis:    Component Value Date/Time   COLORURINE YELLOW 04/26/2020 2013   APPEARANCEUR CLEAR 04/26/2020 2013   LABSPEC <1.005 (L) 04/26/2020 2013   PHURINE 7.0 04/26/2020 2013   GLUCOSEU NEGATIVE 04/26/2020 2013   HGBUR NEGATIVE 04/26/2020 2013   BILIRUBINUR NEGATIVE 04/26/2020 2013   KETONESUR NEGATIVE 04/26/2020 2013   PROTEINUR NEGATIVE 04/26/2020 2013   NITRITE NEGATIVE 04/26/2020 2013   LEUKOCYTESUR NEGATIVE 04/26/2020 2013    Radiological Exams on Admission: CT Angio Head W/Cm &/Or Wo Cm  Result Date: 04/26/2020 CLINICAL DATA:  Neuro deficit, acute, stroke suspected. Additional history provided: Patient reports left arm numbness from elbow to left fingers starting this morning, blurred vision, patient's daughter reports unstable gait, unknown last normal. EXAM: CT ANGIOGRAPHY HEAD AND NECK TECHNIQUE: Multidetector CT imaging of the head and neck was performed using the standard protocol during bolus administration of intravenous contrast. Multiplanar  CT image reconstructions and MIPs were obtained to evaluate the vascular anatomy. Carotid stenosis measurements (when applicable) are obtained utilizing NASCET criteria, using the distal internal carotid diameter as the denominator. CONTRAST:  OMNIPAQUE IOHEXOL 350 MG/ML SOLN COMPARISON:  No pertinent prior exams available for comparison. FINDINGS: CT HEAD FINDINGS Brain: Mild cerebral atrophy. Mild ill-defined hypoattenuation within the cerebral white matter is nonspecific, but compatible with chronic small vessel ischemic disease. A small acute cortical/subcortical infarct (measuring approximately 10 mm) is questioned within the mid to posterior right frontal lobe (for instance as seen on series 4, image 25) (series 8, images 49 and 50). There is no acute  intracranial hemorrhage. No extra-axial fluid collection. No evidence of intracranial mass. No midline shift. Vascular: No hyperdense vessel.  Atherosclerotic calcifications Skull: Normal. Negative for fracture or focal lesion. Sinuses: No significant paranasal sinus disease at the imaged levels. Orbits: No mass or acute finding Review of the MIP images confirms the above findings CTA NECK FINDINGS Aortic arch: Standard aortic branching. Atherosclerotic plaque within the visualized aortic arch and proximal major branch vessels of the neck. No hemodynamically significant innominate or proximal subclavian artery stenosis. Right carotid system: CCA and ICA patent within the neck. Mild soft and calcified plaque within the carotid bifurcation and proximal ICA. Resultant less than 50% stenosis within the proximal ICA. Tortuosity of the cervical ICA. Left carotid system: CCA and ICA patent within the neck without stenosis. Mild soft and calcified plaque within the carotid bifurcation and proximal ICA. Vertebral arteries: Right vertebral artery slightly dominant. Mild nonstenotic calcified plaque at the origin of the right vertebral artery. Mild atheromatous narrowing at the origin of the left vertebral artery. Skeleton: Cervical spondylosis. No acute bony abnormality or aggressive osseous lesion. Other neck: No neck mass or cervical lymphadenopathy. Thyroid unremarkable. Upper chest: No consolidation within the imaged lung apices. Review of the MIP images confirms the above findings CTA HEAD FINDINGS Anterior circulation: The intracranial internal carotid arteries are patent. The M1 middle cerebral arteries are patent. No M2 proximal branch occlusion or high-grade proximal stenosis is identified. The A1 left anterior cerebral artery is developmentally hypoplastic or absent. The anterior cerebral arteries are otherwise patent. No intracranial aneurysm is identified. Posterior circulation: The intracranial vertebral arteries  are patent. The basilar artery is patent. The posterior cerebral arteries are patent. Atherosclerotic irregularity of the posterior cerebral arteries bilaterally. Most notably, there is a severe stenosis within the proximal left PCA at the P1/P2 junction (series 15, image 53). Posterior communicating arteries are hypoplastic or absent bilaterally. Venous sinuses: Within the limitations of contrast timing, no convincing thrombus. Anatomic variants: Posterior communicating arteries are hypoplastic or absent bilaterally. Review of the MIP images confirms the above findings IMPRESSION: CT head: 1. Small acute cortically-based infarct questioned within the mid to posterior right frontal lobe (measuring approximately 10 mm). Consider brain MRI for further evaluation. 2. Mild cerebral atrophy and chronic small vessel ischemic disease. CTA neck: 1. The common carotid and internal carotid arteries are patent within the neck without hemodynamically significant stenosis. Mild atherosclerotic plaque within the carotid bifurcations and proximal ICAs, bilaterally. 2. Vertebral arteries patent within the neck. Mild atherosclerotic narrowing at the origin of both vessels. CTA head: 1. No intracranial large vessel occlusion. 2. 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 3. Calcified plaque within the intracranial internal carotid arteries with sites of mild stenosis, bilaterally. Electronically Signed   By: Jackey Loge DO   On: 04/26/2020  20:42   CT Angio Neck W and/or Wo Contrast  Result Date: 04/26/2020 CLINICAL DATA:  Neuro deficit, acute, stroke suspected. Additional history provided: Patient reports left arm numbness from elbow to left fingers starting this morning, blurred vision, patient's daughter reports unstable gait, unknown last normal. EXAM: CT ANGIOGRAPHY HEAD AND NECK TECHNIQUE: Multidetector CT imaging of the head and neck was performed using the standard protocol  during bolus administration of intravenous contrast. Multiplanar CT image reconstructions and MIPs were obtained to evaluate the vascular anatomy. Carotid stenosis measurements (when applicable) are obtained utilizing NASCET criteria, using the distal internal carotid diameter as the denominator. CONTRAST:  OMNIPAQUE IOHEXOL 350 MG/ML SOLN COMPARISON:  No pertinent prior exams available for comparison. FINDINGS: CT HEAD FINDINGS Brain: Mild cerebral atrophy. Mild ill-defined hypoattenuation within the cerebral white matter is nonspecific, but compatible with chronic small vessel ischemic disease. A small acute cortical/subcortical infarct (measuring approximately 10 mm) is questioned within the mid to posterior right frontal lobe (for instance as seen on series 4, image 25) (series 8, images 49 and 50). There is no acute intracranial hemorrhage. No extra-axial fluid collection. No evidence of intracranial mass. No midline shift. Vascular: No hyperdense vessel.  Atherosclerotic calcifications Skull: Normal. Negative for fracture or focal lesion. Sinuses: No significant paranasal sinus disease at the imaged levels. Orbits: No mass or acute finding Review of the MIP images confirms the above findings CTA NECK FINDINGS Aortic arch: Standard aortic branching. Atherosclerotic plaque within the visualized aortic arch and proximal major branch vessels of the neck. No hemodynamically significant innominate or proximal subclavian artery stenosis. Right carotid system: CCA and ICA patent within the neck. Mild soft and calcified plaque within the carotid bifurcation and proximal ICA. Resultant less than 50% stenosis within the proximal ICA. Tortuosity of the cervical ICA. Left carotid system: CCA and ICA patent within the neck without stenosis. Mild soft and calcified plaque within the carotid bifurcation and proximal ICA. Vertebral arteries: Right vertebral artery slightly dominant. Mild nonstenotic calcified plaque at  the origin of the right vertebral artery. Mild atheromatous narrowing at the origin of the left vertebral artery. Skeleton: Cervical spondylosis. No acute bony abnormality or aggressive osseous lesion. Other neck: No neck mass or cervical lymphadenopathy. Thyroid unremarkable. Upper chest: No consolidation within the imaged lung apices. Review of the MIP images confirms the above findings CTA HEAD FINDINGS Anterior circulation: The intracranial internal carotid arteries are patent. The M1 middle cerebral arteries are patent. No M2 proximal branch occlusion or high-grade proximal stenosis is identified. The A1 left anterior cerebral artery is developmentally hypoplastic or absent. The anterior cerebral arteries are otherwise patent. No intracranial aneurysm is identified. Posterior circulation: The intracranial vertebral arteries are patent. The basilar artery is patent. The posterior cerebral arteries are patent. Atherosclerotic irregularity of the posterior cerebral arteries bilaterally. Most notably, there is a severe stenosis within the proximal left PCA at the P1/P2 junction (series 15, image 53). Posterior communicating arteries are hypoplastic or absent bilaterally. Venous sinuses: Within the limitations of contrast timing, no convincing thrombus. Anatomic variants: Posterior communicating arteries are hypoplastic or absent bilaterally. Review of the MIP images confirms the above findings IMPRESSION: CT head: 1. Small acute cortically-based infarct questioned within the mid to posterior right frontal lobe (measuring approximately 10 mm). Consider brain MRI for further evaluation. 2. Mild cerebral atrophy and chronic small vessel ischemic disease. CTA neck: 1. The common carotid and internal carotid arteries are patent within the neck without hemodynamically significant stenosis. Mild  atherosclerotic plaque within the carotid bifurcations and proximal ICAs, bilaterally. 2. Vertebral arteries patent within the  neck. Mild atherosclerotic narrowing at the origin of both vessels. CTA head: 1. No intracranial large vessel occlusion. 2. 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 3. Calcified plaque within the intracranial internal carotid arteries with sites of mild stenosis, bilaterally. Electronically Signed   By: Jackey Loge DO   On: 04/26/2020 20:42   MR BRAIN WO CONTRAST  Result Date: 04/27/2020 CLINICAL DATA:  Left arm numbness EXAM: MRI HEAD WITHOUT CONTRAST TECHNIQUE: Multiplanar, multiecho pulse sequences of the brain and surrounding structures were obtained without intravenous contrast. COMPARISON:  None. FINDINGS: Brain: Multifocal abnormal diffusion restriction within the right MCA territory. No acute or chronic hemorrhage. There is multifocal hyperintense T2-weighted signal within the white matter. Parenchymal volume and CSF spaces are normal. The midline structures are normal. Vascular: Major flow voids are preserved. Skull and upper cervical spine: Normal calvarium and skull base. Visualized upper cervical spine and soft tissues are normal. Sinuses/Orbits:No paranasal sinus fluid levels or advanced mucosal thickening. No mastoid or middle ear effusion. Normal orbits. IMPRESSION: Multifocal acute ischemia within the right MCA territory. No hemorrhage or mass effect. Electronically Signed   By: Deatra Robinson M.D.   On: 04/27/2020 00:52    EKG: I have ordered EKG and it is pending.   Assessment/Plan Principal Problem:   Stroke  Mr. Gapinski is admitted to medical telemetry floor for CVA.  He has already had MRI brain and CTA head and neck obtained and results reviewed.  Consult Neurology, Dr. Otelia Limes to evaluate pt and await his recommendations.  Obtain echocardiogram to evaluate for PFO, wall motion and ejection fraction. Hypertension of 220/110 will be allowed for 24 hours per stroke protocol.  After which blood pressure will be slowly reduced to  goal level. Antiplatelet therapy with aspirin daily. Continue statin therapy which was initiated in ER.  Check lipid panel.  Neurochecks per stroke protocol  Active Problems:   Elevated blood-pressure reading without diagnosis of hypertension Monitor BP. Permissive HTN for 24 hours and then will work to lower BP to goal and initiate antihypertensive therapy at that time.    DVT prophylaxis: SCDs for DVT prophylaxis Code Status:   Full code Family Communication:  Diagnosis and plan discussed with patient and his daughter who is at bedside.  Questions answered.  Further recommendations to follow as clinically indicated Disposition Plan:   Patient is from:  Home  Anticipated DC to:  Home  Anticipated DC date:  Anticipate 2 midnight stay in the hospital to treat acute condition  Anticipated DC barriers: No barriers to discharge identified at this time  Consults called:  Neurology, Dr. Otelia Limes Admission status:  Inpatient   Claudean Severance Ryna Beckstrom MD [facility]  How to contact the [facility] Attending or Consulting provider 7A - 7P or covering provider during after hours 7P -7A, for this patient?   1. Check the care team in [facility] and look for a) attending/consulting [facility] provider listed and b) the [facility] team listed 2. Log into [email or web address] and use [place]'s universal password to access. If you do not have the password, please contact the hospital operator. 3. Locate the [facility] provider you are looking for under [facility] and page to a number that you can be directly reached. 4. If you still have difficulty reaching the provider, please page the [facility] (Director on Call) for the Hospitalists listed on amion for  assistance.  04/27/2020, 2:41 AM

## 2020-04-27 NOTE — Evaluation (Signed)
Occupational Therapy Evaluation Patient Details Name: Randy Robertson MRN: [id number] DOB: 04/08/1950 Today's Date: 04/27/2020    History of Present Illness Randy Robertson is a 70 y.o. male with no significant past medical history who presents from [facility] for stroke work-up due to left arm weakness and unsteady gait.  Patient speaks [place] and his daughter is at the bedside and interprets for him.  Patient states he does not want another interpreter except for his daughter. 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   Clinical Impression   This 70 yo male admitted with above presents to acute OT with PLOF of being totally independent with all basic ADLs and driving. He currently is at a min guard A at a RW level for all basic ADLs with tub seat recommended for safety in tub/shower). He lives with wife. No further acute OT is recommended, we will sign off.   Follow Up Recommendations  No OT follow up;Supervision/Assistance - 24 hour    Equipment Recommendations  Tub/shower seat       Precautions / Restrictions Precautions Precautions: Fall Restrictions Weight Bearing Restrictions: No      Mobility Bed Mobility Overal bed mobility: Needs Assistance Bed Mobility: Supine to Sit;Sit to Supine     Supine to sit: Supervision Sit to supine: Supervision        Transfers Overall transfer level: Needs assistance Equipment used: Rolling walker (2 wheeled) Transfers: Sit to/from Stand Sit to Stand: Min guard              Balance Overall balance assessment: Needs assistance Sitting-balance support: No upper extremity supported;Feet supported Sitting balance-Leahy Scale: Good     Standing balance support: Bilateral upper extremity supported Standing balance-Leahy Scale: Poor                             ADL either performed or assessed with clinical judgement   ADL Overall ADL's : Needs assistance/impaired Eating/Feeding:  Independent;Bed level   Grooming: Supervision/safety;Standing   Upper Body Bathing: Set up;Supervision/ safety;Sitting   Lower Body Bathing: Min guard;Sit to/from stand   Upper Body Dressing : Supervision/safety;Set up;Sitting   Lower Body Dressing: Min guard;Sit to/from stand   Toilet Transfer: Min guard;Ambulation;RW;Comfort height toilet   Toileting- Clothing Manipulation and Hygiene: Min guard;Sit to/from stand         General ADL Comments: Edcuated pt and dtr that pt needs to use a RW anytime he is up on his feet. Also recommended pt use a shower seat at home due to his unsteady gait     Vision Baseline Vision/History: Wears glasses Wears Glasses: At all times Patient Visual Report: No change from baseline (initially blurring, now reports this is not happening.) Vision Assessment?: Yes Eye Alignment: Within Functional Limits Ocular Range of Motion: Within Functional Limits Alignment/Gaze Preference: Within Defined Limits Tracking/Visual Pursuits: Able to track stimulus in all quads without difficulty Saccades: Additional eye shifts occurred during testing;Overshoots;Undershoots;Decreased speed of saccadic movement Visual Fields: No apparent deficits Additional Comments: Pt really having trouble with saccades testing and at times looks like he is "looking through you" not at you. Dtr reports this is was going on before this incident.            Pertinent Vitals/Pain Pain Assessment: No/denies pain     Hand Dominance  right   Extremity/Trunk Assessment Upper Extremity Assessment Upper Extremity Assessment: Overall WFL for tasks assessed  Communication Communication Communication: Prefers language other than English (DAughter prefers to interpret)   Cognition Arousal/Alertness: Awake/alert Behavior During Therapy: WFL for tasks assessed/performed Overall Cognitive Status: Impaired/Different from baseline                                  General Comments: Difficulty following commands for vision testing (even with dtr interpreting)              Home Living Family/patient expects to be discharged to:: Private residence Living Arrangements: Spouse/significant other Available Help at Discharge: Family;Available 24 hours/day Type of Home: House Home Access: Level entry     Home Layout: One level     Bathroom Shower/Tub: Tub/shower unit;Door;Curtain   Firefighter: Standard     Home Equipment: None          Prior Functioning/Environment Level of Independence: Independent                 OT Problem List: Impaired balance (sitting and/or standing)                       Co-evaluation PT/OT/SLP Co-Evaluation/Treatment: Yes Reason for Co-Treatment: For patient/therapist safety;To address functional/ADL transfers PT goals addressed during session: Mobility/safety with mobility;Balance;Proper use of DME;Strengthening/ROM OT goals addressed during session: Strengthening/ROM;ADL's and self-care      AM-PAC OT "6 Clicks" Daily Activity     Outcome Measure Help from another person eating meals?: None Help from another person taking care of personal grooming?: A Little Help from another person toileting, which includes using toliet, bedpan, or urinal?: A Little Help from another person bathing (including washing, rinsing, drying)?: A Little Help from another person to put on and taking off regular upper body clothing?: A Little Help from another person to put on and taking off regular lower body clothing?: A Little 6 Click Score: 19   End of Session Equipment Utilized During Treatment: Gait belt;Rolling walker Nurse Communication:  (pt needs a lead replaced (non in room), recommending a shower seat (pt aware he needs to pay for it))  Activity Tolerance: Patient tolerated treatment well Patient left: in bed;with call bell/phone within reach;with family/visitor present  OT Visit Diagnosis:  Unsteadiness on feet (R26.81);Other abnormalities of gait and mobility (R26.89)                Time: 9528-4132 OT Time Calculation (min): 29 min Charges:  OT General Charges $OT Visit: 1 Visit OT Evaluation $OT Eval Moderate Complexity: 1 Mod  Ignacia Palma, OTR/L Acute [employer] Pager [phone] Office [phone]     Evette Georges 04/27/2020, 2:03 PM

## 2020-04-27 NOTE — ED Notes (Signed)
Daughter @ this time requesting pt be transferred to another hospital when RN asked why daughter stated because "its a better hospital." Daughter then came up to this RN and stated nevermind we will wait to see how this goes." Neurologist @ bedside talking w/ daughter and pt. Updated on awaiting bed upstairs.

## 2020-04-27 NOTE — Discharge Summary (Signed)
Physician Discharge Summary  Randy Robertson BJY:[id number] DOB: Aug 31, 1950 DOA: 04/26/2020  PCP: Pcp, No  Admit date: 04/26/2020 Discharge date: 04/27/2020  Admitted From: Home Disposition:  Home  Recommendations for Outpatient Follow-up:  1. Follow up with PCP in 1-2 weeks 2. Follow up with Neurology as scheduled 3. Follow up with Cardiology for outpatient loop recorder placement  Discharge Condition:Stable CODE STATUS:Full Diet recommendation: Heart healthy   Brief/Interim Summary: 70 y.o. male with no significant past medical history who presents from [facility] ER for stroke work-up.  Patient speaks [place] and his daughter is at the bedside and interprets for him.  Patient states he does not want another interpreter except for his daughter.  Reportedly patient was in his normal state of health until the morning of April 26, 2020 when he noticed some left arm numbness and weakness from his elbow to his fingers.  He also had some unsteady gait according to his daughter but he did not fall.  Around 3:00 in the afternoon he developed some blurry vision and decided to go to the emergency room at [facility] for evaluation.  CT angiography of the head neck was obtained which showed possible area of stroke in the right side of the brain and he was sent to [facility] ER for MRI.  MRI was positive for stroke in the right MCA territory.  Patient denies any chronic medical problems and does not take any medications on a regular basis at home.  He denies any chest pain, palpitations, shortness of breath, syncope, nausea, vomiting, diarrhea.  He denies any recent fever or illness.  Denies any recent travel.  He is unsure of his cholesterol level and does not check his blood pressure regularly at home.  Discharge Diagnoses:  Principal Problem:   Stroke [facility]) Active Problems:   Elevated blood-pressure reading without diagnosis of hypertension  Principal Problem:   Stroke  -Acute stroke seen and reviewed  on MRI -Neurology consulted -2d echo reviewed, unremarkable -Pt was seen by therapy with recs for outpt PT -Discussed with stroke MD, clear for d/c today -Pt to f/u with Cardiology as outpatient for loop recorder placement  Active Problems:   Elevated blood-pressure reading without diagnosis of hypertension -Remained stable this visit -Recommend close outpatient follow up.   Discharge Instructions  Discharge Instructions    Ambulatory referral to Cardiac Electrophysiology   Complete by: As directed    Request Loop recorder placement. Thank you.   Ambulatory referral to Neurology   Complete by: As directed    Follow up in stroke clinic at [facility] in about 4 weeks.   Ambulatory referral to Physical Therapy   Complete by: As directed      Allergies as of 04/27/2020   No Known Allergies     Medication List    TAKE these medications   aspirin 81 MG chewable tablet Chew 1 tablet (81 mg total) by mouth daily. Start taking on: April 28, 2020   atorvastatin 40 MG tablet Commonly known as: LIPITOR Take 1 tablet (40 mg total) by mouth daily. Start taking on: April 28, 2020   clopidogrel 75 MG tablet Commonly known as: PLAVIX Take 1 tablet (75 mg total) by mouth daily for 21 days. Start taking on: April 28, 2020            Durable Medical Equipment  (From admission, onward)         Start     Ordered   04/27/20 1336  For home  use only DME 3 n 1  Once        04/27/20 1336   04/27/20 1333  For home use only DME Shower stool  Once       Comments: With back   04/27/20 1332   04/27/20 1330  For home use only DME Walker rolling  Once       Question Answer Comment  Walker: With 5 Inch Wheels   Patient needs a walker to treat with the following condition Acute right MCA stroke (HCC)      04/27/20 1332          Follow-up Information    [facility] Follow [facility].   Specialty: Rehabilitation Why: will  call you and schedule appointment  Contact information: [address] [id number] mc [place] [address] [phone]       [facility] Follow up in 4 week(s).   Specialty: Neurology Contact information: [address] [place] [address] [phone]       Hillis Range, MD. Schedule an appointment as soon as possible for a visit in 4 week(s).   Specialty: Cardiology Why: loop recorder placement Contact information: [address] [place] [address] [phone]        Follow up with your PCP. Schedule an appointment as soon as possible for a visit in 2 week(s).              No Known Allergies  Consultations:  Neurology  Procedures/Studies: CT Angio Head W/Cm &/Or Wo Cm  Result Date: 04/26/2020 CLINICAL DATA:  Neuro deficit, acute, stroke suspected. Additional history provided: Patient reports left arm numbness from elbow to left fingers starting this morning, blurred vision, patient's daughter reports unstable gait, unknown last normal. EXAM: CT ANGIOGRAPHY HEAD AND NECK TECHNIQUE: Multidetector CT imaging of the head and neck was performed using the standard protocol during bolus administration of intravenous contrast. Multiplanar CT image reconstructions and MIPs were obtained to evaluate the vascular anatomy. Carotid stenosis measurements (when applicable) are obtained utilizing NASCET criteria, using the distal internal carotid diameter as the denominator. CONTRAST:  OMNIPAQUE IOHEXOL 350 MG/ML SOLN COMPARISON:  No pertinent prior exams available for comparison. FINDINGS: CT HEAD FINDINGS Brain: Mild cerebral atrophy. Mild ill-defined hypoattenuation within the cerebral white matter is nonspecific, but compatible with chronic small vessel ischemic disease. A small acute cortical/subcortical infarct (measuring approximately 10 mm) is questioned within the mid to posterior right  frontal lobe (for instance as seen on series 4, image 25) (series 8, images 49 and 50). There is no acute intracranial hemorrhage. No extra-axial fluid collection. No evidence of intracranial mass. No midline shift. Vascular: No hyperdense vessel.  Atherosclerotic calcifications Skull: Normal. Negative for fracture or focal lesion. Sinuses: No significant paranasal sinus disease at the imaged levels. Orbits: No mass or acute finding Review of the MIP images confirms the above findings CTA NECK FINDINGS Aortic arch: Standard aortic branching. Atherosclerotic plaque within the visualized aortic arch and proximal major branch vessels of the neck. No hemodynamically significant innominate or proximal subclavian artery stenosis. Right carotid system: CCA and ICA patent within the neck. Mild soft and calcified plaque within the carotid bifurcation and proximal ICA. Resultant less than 50% stenosis within the proximal ICA. Tortuosity of the cervical ICA. Left carotid system: CCA and ICA patent within the neck without stenosis. Mild soft and calcified plaque within the carotid bifurcation and proximal ICA. Vertebral arteries: Right vertebral artery slightly  dominant. Mild nonstenotic calcified plaque at the origin of the right vertebral artery. Mild atheromatous narrowing at the origin of the left vertebral artery. Skeleton: Cervical spondylosis. No acute bony abnormality or aggressive osseous lesion. Other neck: No neck mass or cervical lymphadenopathy. Thyroid unremarkable. Upper chest: No consolidation within the imaged lung apices. Review of the MIP images confirms the above findings CTA HEAD FINDINGS Anterior circulation: The intracranial internal carotid arteries are patent. The M1 middle cerebral arteries are patent. No M2 proximal branch occlusion or high-grade proximal stenosis is identified. The A1 left anterior cerebral artery is developmentally hypoplastic or absent. The anterior cerebral arteries are otherwise  patent. No intracranial aneurysm is identified. Posterior circulation: The intracranial vertebral arteries are patent. The basilar artery is patent. The posterior cerebral arteries are patent. Atherosclerotic irregularity of the posterior cerebral arteries bilaterally. Most notably, there is a severe stenosis within the proximal left PCA at the P1/P2 junction (series 15, image 53). Posterior communicating arteries are hypoplastic or absent bilaterally. Venous sinuses: Within the limitations of contrast timing, no convincing thrombus. Anatomic variants: Posterior communicating arteries are hypoplastic or absent bilaterally. Review of the MIP images confirms the above findings IMPRESSION: CT head: 1. Small acute cortically-based infarct questioned within the mid to posterior right frontal lobe (measuring approximately 10 mm). Consider brain MRI for further evaluation. 2. Mild cerebral atrophy and chronic small vessel ischemic disease. CTA neck: 1. The common carotid and internal carotid arteries are patent within the neck without hemodynamically significant stenosis. Mild atherosclerotic plaque within the carotid bifurcations and proximal ICAs, bilaterally. 2. Vertebral arteries patent within the neck. Mild atherosclerotic narrowing at the origin of both vessels. CTA head: 1. No intracranial large vessel occlusion. 2. 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 3. Calcified plaque within the intracranial internal carotid arteries with sites of mild stenosis, bilaterally. Electronically Signed   By: Jackey Loge DO   On: 04/26/2020 20:42   CT Angio Neck W and/or Wo Contrast  Result Date: 04/26/2020 CLINICAL DATA:  Neuro deficit, acute, stroke suspected. Additional history provided: Patient reports left arm numbness from elbow to left fingers starting this morning, blurred vision, patient's daughter reports unstable gait, unknown last normal. EXAM: CT ANGIOGRAPHY HEAD  AND NECK TECHNIQUE: Multidetector CT imaging of the head and neck was performed using the standard protocol during bolus administration of intravenous contrast. Multiplanar CT image reconstructions and MIPs were obtained to evaluate the vascular anatomy. Carotid stenosis measurements (when applicable) are obtained utilizing NASCET criteria, using the distal internal carotid diameter as the denominator. CONTRAST:  OMNIPAQUE IOHEXOL 350 MG/ML SOLN COMPARISON:  No pertinent prior exams available for comparison. FINDINGS: CT HEAD FINDINGS Brain: Mild cerebral atrophy. Mild ill-defined hypoattenuation within the cerebral white matter is nonspecific, but compatible with chronic small vessel ischemic disease. A small acute cortical/subcortical infarct (measuring approximately 10 mm) is questioned within the mid to posterior right frontal lobe (for instance as seen on series 4, image 25) (series 8, images 49 and 50). There is no acute intracranial hemorrhage. No extra-axial fluid collection. No evidence of intracranial mass. No midline shift. Vascular: No hyperdense vessel.  Atherosclerotic calcifications Skull: Normal. Negative for fracture or focal lesion. Sinuses: No significant paranasal sinus disease at the imaged levels. Orbits: No mass or acute finding Review of the MIP images confirms the above findings CTA NECK FINDINGS Aortic arch: Standard aortic branching. Atherosclerotic plaque within the visualized aortic arch and proximal major branch vessels of the  neck. No hemodynamically significant innominate or proximal subclavian artery stenosis. Right carotid system: CCA and ICA patent within the neck. Mild soft and calcified plaque within the carotid bifurcation and proximal ICA. Resultant less than 50% stenosis within the proximal ICA. Tortuosity of the cervical ICA. Left carotid system: CCA and ICA patent within the neck without stenosis. Mild soft and calcified plaque within the carotid bifurcation and  proximal ICA. Vertebral arteries: Right vertebral artery slightly dominant. Mild nonstenotic calcified plaque at the origin of the right vertebral artery. Mild atheromatous narrowing at the origin of the left vertebral artery. Skeleton: Cervical spondylosis. No acute bony abnormality or aggressive osseous lesion. Other neck: No neck mass or cervical lymphadenopathy. Thyroid unremarkable. Upper chest: No consolidation within the imaged lung apices. Review of the MIP images confirms the above findings CTA HEAD FINDINGS Anterior circulation: The intracranial internal carotid arteries are patent. The M1 middle cerebral arteries are patent. No M2 proximal branch occlusion or high-grade proximal stenosis is identified. The A1 left anterior cerebral artery is developmentally hypoplastic or absent. The anterior cerebral arteries are otherwise patent. No intracranial aneurysm is identified. Posterior circulation: The intracranial vertebral arteries are patent. The basilar artery is patent. The posterior cerebral arteries are patent. Atherosclerotic irregularity of the posterior cerebral arteries bilaterally. Most notably, there is a severe stenosis within the proximal left PCA at the P1/P2 junction (series 15, image 53). Posterior communicating arteries are hypoplastic or absent bilaterally. Venous sinuses: Within the limitations of contrast timing, no convincing thrombus. Anatomic variants: Posterior communicating arteries are hypoplastic or absent bilaterally. Review of the MIP images confirms the above findings IMPRESSION: CT head: 1. Small acute cortically-based infarct questioned within the mid to posterior right frontal lobe (measuring approximately 10 mm). Consider brain MRI for further evaluation. 2. Mild cerebral atrophy and chronic small vessel ischemic disease. CTA neck: 1. The common carotid and internal carotid arteries are patent within the neck without hemodynamically significant stenosis. Mild atherosclerotic  plaque within the carotid bifurcations and proximal ICAs, bilaterally. 2. Vertebral arteries patent within the neck. Mild atherosclerotic narrowing at the origin of both vessels. CTA head: 1. No intracranial large vessel occlusion. 2. 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 3. Calcified plaque within the intracranial internal carotid arteries with sites of mild stenosis, bilaterally. Electronically Signed   By: Jackey LogeKyle  Golden DO   On: 04/26/2020 20:42   MR BRAIN WO CONTRAST  Result Date: 04/27/2020 CLINICAL DATA:  Left arm numbness EXAM: MRI HEAD WITHOUT CONTRAST TECHNIQUE: Multiplanar, multiecho pulse sequences of the brain and surrounding structures were obtained without intravenous contrast. COMPARISON:  None. FINDINGS: Brain: Multifocal abnormal diffusion restriction within the right MCA territory. No acute or chronic hemorrhage. There is multifocal hyperintense T2-weighted signal within the white matter. Parenchymal volume and CSF spaces are normal. The midline structures are normal. Vascular: Major flow voids are preserved. Skull and upper cervical spine: Normal calvarium and skull base. Visualized upper cervical spine and soft tissues are normal. Sinuses/Orbits:No paranasal sinus fluid levels or advanced mucosal thickening. No mastoid or middle ear effusion. Normal orbits. IMPRESSION: Multifocal acute ischemia within the right MCA territory. No hemorrhage or mass effect. Electronically Signed   By: Deatra RobinsonKevin  Herman M.D.   On: 04/27/2020 00:52   ECHOCARDIOGRAM COMPLETE  Result Date: 04/27/2020    ECHOCARDIOGRAM REPORT   Patient Name:   Randy Robertson Date of Exam: 04/27/2020 Medical Rec #:  [id number]         Height:  71.0 in Accession #:    [id number]        Weight:       261.0 lb Date of Birth:  [birth year]         BSA:          2.361 m Patient Age:    70 years          BP:           158/87 mmHg Patient Gender: M                 HR:           60 bpm.  Exam Location:  Inpatient Procedure: 2D Echo, Cardiac Doppler and Color Doppler Indications:    Stroke  History:        Patient has no prior history of Echocardiogram examinations.                 CHF, CAD; COPD.  Sonographer:    Shirlean Kelly Referring Phys: [id number] BRADLEY S CHOTINER IMPRESSIONS  1. Left ventricular ejection fraction, by estimation, is 60 to 65%. The left ventricle has normal function. The left ventricle has no regional wall motion abnormalities. There is mild left ventricular hypertrophy. Left ventricular diastolic parameters are consistent with Grade I diastolic dysfunction (impaired relaxation).  2. Right ventricular systolic function is normal. The right ventricular size is normal.  3. Left atrial size was moderately dilated.  4. The mitral valve is normal in structure. No evidence of mitral valve regurgitation. No evidence of mitral stenosis.  5. The aortic valve is tricuspid. There is moderate calcification of the aortic valve. Aortic valve regurgitation is trivial. Mild to moderate aortic valve sclerosis/calcification is present, without any evidence of aortic stenosis.  6. The inferior vena cava is normal in size with greater than 50% respiratory variability, suggesting right atrial pressure of 3 mmHg. FINDINGS  Left Ventricle: Left ventricular ejection fraction, by estimation, is 60 to 65%. The left ventricle has normal function. The left ventricle has no regional wall motion abnormalities. The left ventricular internal cavity size was normal in size. There is  mild left ventricular hypertrophy. Left ventricular diastolic parameters are consistent with Grade I diastolic dysfunction (impaired relaxation). Right Ventricle: The right ventricular size is normal. No increase in right ventricular wall thickness. Right ventricular systolic function is normal. Left Atrium: Left atrial size was moderately dilated. Right Atrium: Right atrial size was normal in size. Pericardium: There is no  evidence of pericardial effusion. Mitral Valve: The mitral valve is normal in structure. Mild mitral annular calcification. No evidence of mitral valve regurgitation. No evidence of mitral valve stenosis. Tricuspid Valve: The tricuspid valve is normal in structure. Tricuspid valve regurgitation is trivial. No evidence of tricuspid stenosis. Aortic Valve: The aortic valve is tricuspid. There is moderate calcification of the aortic valve. Aortic valve regurgitation is trivial. Mild to moderate aortic valve sclerosis/calcification is present, without any evidence of aortic stenosis. Aortic valve mean gradient measures 4.0 mmHg. Aortic valve peak gradient measures 9.5 mmHg. Aortic valve area, by VTI measures 3.40 cm. Pulmonic Valve: The pulmonic valve was normal in structure. Pulmonic valve regurgitation is trivial. No evidence of pulmonic stenosis. Aorta: The aortic root is normal in size and structure. Venous: The inferior vena cava is normal in size with greater than 50% respiratory variability, suggesting right atrial pressure of 3 mmHg. IAS/Shunts: No atrial level shunt detected by color flow Doppler.  LEFT VENTRICLE PLAX 2D LVIDd:  5.15 cm LVIDs:         3.70 cm LV PW:         1.15 cm LV IVS:        1.15 cm LVOT diam:     2.40 cm LV SV:         86 LV SV Index:   37 LVOT Area:     4.52 cm  LV Volumes (MOD) LV vol d, MOD A2C: 151.0 ml LV vol d, MOD A4C: 153.0 ml LV vol s, MOD A2C: 67.2 ml LV vol s, MOD A4C: 66.1 ml LV SV MOD A2C:     83.8 ml LV SV MOD A4C:     153.0 ml LV SV MOD BP:      86.0 ml RIGHT VENTRICLE RV Basal diam:  3.60 cm RV S prime:     18.80 cm/s TAPSE (M-mode): 2.9 cm LEFT ATRIUM             Index       RIGHT ATRIUM           Index LA diam:        4.30 cm 1.82 cm/m  RA Area:     14.50 cm LA Vol (A2C):   53.2 ml 22.53 ml/m RA Volume:   32.10 ml  13.59 ml/m LA Vol (A4C):   48.2 ml 20.41 ml/m LA Biplane Vol: 53.2 ml 22.53 ml/m  AORTIC VALVE AV Area (Vmax):    3.14 cm AV Area (Vmean):    3.26 cm AV Area (VTI):     3.40 cm AV Vmax:           154.00 cm/s AV Vmean:          96.500 cm/s AV VTI:            0.254 m AV Peak Grad:      9.5 mmHg AV Mean Grad:      4.0 mmHg LVOT Vmax:         107.00 cm/s LVOT Vmean:        69.600 cm/s LVOT VTI:          0.191 m LVOT/AV VTI ratio: 0.75  AORTA Ao Root diam: 3.60 cm Ao Asc diam:  3.50 cm MITRAL VALVE MV Area (PHT): 2.73 cm     SHUNTS MV Decel Time: 278 msec     Systemic VTI:  0.19 m MV E velocity: 103.00 cm/s  Systemic Diam: 2.40 cm MV A velocity: 125.00 cm/s MV E/A ratio:  0.82 Arvilla Meres MD Electronically signed by Arvilla Meres MD Signature Date/Time: 04/27/2020/3:24:25 PM    Final      Subjective: Eager to go home  Discharge Exam: Vitals:   04/27/20 1100 04/27/20 1400  BP: (!) 162/87 (!) 159/100  Pulse: 70 79  Resp: 18 19  Temp:    SpO2: 93% 96%   Vitals:   04/27/20 0800 04/27/20 0900 04/27/20 1100 04/27/20 1400  BP: (!) 158/87 (!) 146/76 (!) 162/87 (!) 159/100  Pulse: 60 (!) 59 70 79  Resp: 17 19 18 19   Temp:      TempSrc:      SpO2: 93% 93% 93% 96%  Weight:        General: Pt is alert, awake, not in acute distress Cardiovascular: RRR, S1/S2 +, no rubs, no gallops Respiratory: CTA bilaterally, no wheezing, no rhonchi Abdominal: Soft, NT, ND, bowel sounds + Extremities: no edema, no cyanosis   The results of significant diagnostics from this hospitalization (  including imaging, microbiology, ancillary and laboratory) are listed below for reference.     Microbiology: Recent Results (from the past 240 hour(s))  Resp Panel by RT-PCR (Flu A&B, Covid) Nasopharyngeal Swab     Status: None   Collection Time: 04/26/20  6:48 PM   Specimen: Nasopharyngeal Swab; Nasopharyngeal(NP) swabs in vial transport medium  Result Value Ref Range Status   SARS Coronavirus 2 by RT PCR NEGATIVE NEGATIVE Final    Comment: (NOTE) SARS-CoV-2 target nucleic acids are NOT DETECTED.  The SARS-CoV-2 RNA is generally detectable in  upper respiratory specimens during the acute phase of infection. The lowest concentration of SARS-CoV-2 viral copies this assay can detect is 138 copies/mL. A negative result does not preclude SARS-Cov-2 infection and should not be used as the sole basis for treatment or other patient management decisions. A negative result may occur with  improper specimen collection/handling, submission of specimen other than nasopharyngeal swab, presence of viral mutation(s) within the areas targeted by this assay, and inadequate number of viral copies(<138 copies/mL). A negative result must be combined with clinical observations, patient history, and epidemiological information. The expected result is Negative.  Fact Sheet for Patients:  BloggerCourse.com  Fact Sheet for Healthcare Providers:  SeriousBroker.it  This test is no t yet approved or cleared by the [place] FDA and  has been authorized for detection and/or diagnosis of SARS-CoV-2 by FDA under an Emergency Use Authorization (EUA). This EUA will remain  in effect (meaning this test can be used) for the duration of the COVID-19 declaration under Section 564(b)(1) of the Act, 21 U.S.C.section 360bbb-3(b)(1), unless the authorization is terminated  or revoked sooner.       Influenza A by PCR NEGATIVE NEGATIVE Final   Influenza B by PCR NEGATIVE NEGATIVE Final    Comment: (NOTE) The Xpert Xpress SARS-CoV-2/FLU/RSV plus assay is intended as an aid in the diagnosis of influenza from Nasopharyngeal swab specimens and should not be used as a sole basis for treatment. Nasal washings and aspirates are unacceptable for Xpert Xpress SARS-CoV-2/FLU/RSV testing.  Fact Sheet for Patients: BloggerCourse.com  Fact Sheet for Healthcare Providers: SeriousBroker.it  This test is not yet approved or cleared by the [place] FDA and has been  authorized for detection and/or diagnosis of SARS-CoV-2 by FDA under an Emergency Use Authorization (EUA). This EUA will remain in effect (meaning this test can be used) for the duration of the COVID-19 declaration under Section 564(b)(1) of the Act, 21 U.S.C. section 360bbb-3(b)(1), unless the authorization is terminated or revoked.  Performed at [facility] [address] [place], [place] [address]      Labs: BNP (last 3 results) No results for input(s): BNP in the last 8760 hours. Basic Metabolic Panel: Recent Labs  Lab 04/26/20 1900  NA 136  K 3.7  CL 100  CO2 26  GLUCOSE 96  BUN 10  CREATININE 0.72  CALCIUM 8.9   Liver Function Tests: Recent Labs  Lab 04/26/20 1900  AST 30  ALT 39  ALKPHOS 60  BILITOT 0.9  PROT 7.4  ALBUMIN 4.4   No results for input(s): LIPASE, AMYLASE in the last 168 hours. No results for input(s): AMMONIA in the last 168 hours. CBC: Recent Labs  Lab 04/26/20 1900  WBC 10.5  NEUTROABS 6.9  HGB 15.0  HCT 43.2  MCV 89.3  PLT 219   Cardiac Enzymes: No results for input(s): CKTOTAL, CKMB, CKMBINDEX, TROPONINI in the last 168 hours. BNP: Invalid input(s):  POCBNP CBG: No results for input(s): GLUCAP in the last 168 hours. D-Dimer No results for input(s): DDIMER in the last 72 hours. Hgb A1c No results for input(s): HGBA1C in the last 72 hours. Lipid Profile Recent Labs    04/27/20 0411  CHOL 194  HDL 42  LDLCALC 132*  TRIG 99  CHOLHDL 4.6   Thyroid function studies No results for input(s): TSH, T4TOTAL, T3FREE, THYROIDAB in the last 72 hours.  Invalid input(s): FREET3 Anemia work up No results for input(s): VITAMINB12, FOLATE, FERRITIN, TIBC, IRON, RETICCTPCT in the last 72 hours. Urinalysis    Component Value Date/Time   COLORURINE YELLOW 04/26/2020 2013   APPEARANCEUR CLEAR 04/26/2020 2013   LABSPEC <1.005 (L) 04/26/2020 2013   PHURINE 7.0 04/26/2020 2013   GLUCOSEU NEGATIVE 04/26/2020 2013   HGBUR  NEGATIVE 04/26/2020 2013   BILIRUBINUR NEGATIVE 04/26/2020 2013   KETONESUR NEGATIVE 04/26/2020 2013   PROTEINUR NEGATIVE 04/26/2020 2013   NITRITE NEGATIVE 04/26/2020 2013   LEUKOCYTESUR NEGATIVE 04/26/2020 2013   Sepsis Labs Invalid input(s): PROCALCITONIN,  WBC,  LACTICIDVEN Microbiology Recent Results (from the past 240 hour(s))  Resp Panel by RT-PCR (Flu A&B, Covid) Nasopharyngeal Swab     Status: None   Collection Time: 04/26/20  6:48 PM   Specimen: Nasopharyngeal Swab; Nasopharyngeal(NP) swabs in vial transport medium  Result Value Ref Range Status   SARS Coronavirus 2 by RT PCR NEGATIVE NEGATIVE Final    Comment: (NOTE) SARS-CoV-2 target nucleic acids are NOT DETECTED.  The SARS-CoV-2 RNA is generally detectable in upper respiratory specimens during the acute phase of infection. The lowest concentration of SARS-CoV-2 viral copies this assay can detect is 138 copies/mL. A negative result does not preclude SARS-Cov-2 infection and should not be used as the sole basis for treatment or other patient management decisions. A negative result may occur with  improper specimen collection/handling, submission of specimen other than nasopharyngeal swab, presence of viral mutation(s) within the areas targeted by this assay, and inadequate number of viral copies(<138 copies/mL). A negative result must be combined with clinical observations, patient history, and epidemiological information. The expected result is Negative.  Fact Sheet for Patients:  BloggerCourse.com  Fact Sheet for Healthcare Providers:  SeriousBroker.it  This test is no t yet approved or cleared by the [place] FDA and  has been authorized for detection and/or diagnosis of SARS-CoV-2 by FDA under an Emergency Use Authorization (EUA). This EUA will remain  in effect (meaning this test can be used) for the duration of the COVID-19 declaration under Section  564(b)(1) of the Act, 21 U.S.C.section 360bbb-3(b)(1), unless the authorization is terminated  or revoked sooner.       Influenza A by PCR NEGATIVE NEGATIVE Final   Influenza B by PCR NEGATIVE NEGATIVE Final    Comment: (NOTE) The Xpert Xpress SARS-CoV-2/FLU/RSV plus assay is intended as an aid in the diagnosis of influenza from Nasopharyngeal swab specimens and should not be used as a sole basis for treatment. Nasal washings and aspirates are unacceptable for Xpert Xpress SARS-CoV-2/FLU/RSV testing.  Fact Sheet for Patients: BloggerCourse.com  Fact Sheet for Healthcare Providers: SeriousBroker.it  This test is not yet approved or cleared by the [place] FDA and has been authorized for detection and/or diagnosis of SARS-CoV-2 by FDA under an Emergency Use Authorization (EUA). This EUA will remain in effect (meaning this test can be used) for the duration of the COVID-19 declaration under Section 564(b)(1) of the Act, 21 U.S.C. section 360bbb-3(b)(1), unless the authorization  is terminated or revoked.  Performed at [facility], [address] [place], [place] [address]    Time spent: 30 min  SIGNED:   Rickey Barbara, MD  [facility] 04/27/2020, 4:29 PM  If 7PM-7AM, please contact night-coverage

## 2020-04-27 NOTE — Consult Note (Signed)
NEURO HOSPITALIST CONSULT NOTE   Requesting physician: Dr. Manus Gunning  Reason for Consult: Acute right MCA ischemic infarctions on MRI  History obtained from:  Patient, Daughter and Chart     HPI:                                                                                                                                          Randy Robertson is an 70 y.o. male presenting from [facility] ED for stroke management after MRI there revealed  acute right MCA ischemic infarctions. Daughter translates for her father, who speaks [place]. She reports that the patient had a c/c of left arm numbness from elbow to left fingers starting Thursday AM at 0800-0830. He then developed blurred vision at 3 PM.  Daughter also noted unstable gait. The patient denied any headache or vision changes.     PMHx Prediabetes corrected by diet No other medical history per daughter  No family history on file.            Social History:  has no history on file for tobacco use, alcohol use, and drug use.  No Known Allergies  MEDICATIONS:                                                                                                                     No home medications per daughter  Scheduled Inpatient: .  stroke: mapping our early stages of recovery book   Does not apply Once  . aspirin  81 mg Oral Daily  . atorvastatin  40 mg Oral Daily   Continuous: . sodium chloride       ROS:  As per HPI.    Blood pressure (!) 165/90, pulse 67, temperature 98.2 F (36.8 C), temperature source Oral, resp. rate 14, weight 118.4 kg, SpO2 96 %.   General Examination:                                                                                                       Physical Exam  HEENT-  [place]/AT   Lungs- Respirations unlabored Extremities- Warm and well  perfused  Neurological Examination Mental Status: Awake and alert. Oriented x 5. Pleasant and cooperative. Speech fluent with intact naming and comprehension. Subtle dysarthria Cranial Nerves: II: Temporal visual fields intact with no extinction to DSS. PERRL.   III,IV, VI: No ptosis. EOMI.  V,VII: Smile symmetric, facial temp sensation equal bilaterally VIII: Hearing intact to voice IX,X: No hypophonia XI: Symmetric XII: Midline tongue extension Motor: LUE 4+/5 with slight pronator drift LLE 4+/5 with slight lag in initiation of movement after command RUE and RLE 5/5 Sensory: Temp and light touch intact throughout, bilaterally. No extinction to DSS.  Deep Tendon Reflexes:  1+ bilateral brachioradialis and biceps 2+ bilateral patellae Toes downgoing bilaterally Cerebellar: Mild dystaxia with FNF on the left. Normal on the right.  Gait: Deferred   Lab Results: Basic Metabolic Panel: Recent Labs  Lab 04/26/20 1900  NA 136  K 3.7  CL 100  CO2 26  GLUCOSE 96  BUN 10  CREATININE 0.72  CALCIUM 8.9    CBC: Recent Labs  Lab 04/26/20 1900  WBC 10.5  NEUTROABS 6.9  HGB 15.0  HCT 43.2  MCV 89.3  PLT 219    Cardiac Enzymes: No results for input(s): CKTOTAL, CKMB, CKMBINDEX, TROPONINI in the last 168 hours.  Lipid Panel: No results for input(s): CHOL, TRIG, HDL, CHOLHDL, VLDL, LDLCALC in the last 168 hours.  Imaging: CT Angio Head W/Cm &/Or Wo Cm  Result Date: 04/26/2020 CLINICAL DATA:  Neuro deficit, acute, stroke suspected. Additional history provided: Patient reports left arm numbness from elbow to left fingers starting this morning, blurred vision, patient's daughter reports unstable gait, unknown last normal. EXAM: CT ANGIOGRAPHY HEAD AND NECK TECHNIQUE: Multidetector CT imaging of the head and neck was performed using the standard protocol during bolus administration of intravenous contrast. Multiplanar CT image reconstructions and MIPs were obtained to evaluate  the vascular anatomy. Carotid stenosis measurements (when applicable) are obtained utilizing NASCET criteria, using the distal internal carotid diameter as the denominator. CONTRAST:  OMNIPAQUE IOHEXOL 350 MG/ML SOLN COMPARISON:  No pertinent prior exams available for comparison. FINDINGS: CT HEAD FINDINGS Brain: Mild cerebral atrophy. Mild ill-defined hypoattenuation within the cerebral white matter is nonspecific, but compatible with chronic small vessel ischemic disease. A small acute cortical/subcortical infarct (measuring approximately 10 mm) is questioned within the mid to posterior right frontal lobe (for instance as seen on series 4, image 25) (series 8, images 49 and 50). There is no acute intracranial hemorrhage. No extra-axial fluid collection. No evidence of intracranial mass. No midline shift. Vascular: No hyperdense vessel.  Atherosclerotic calcifications Skull: Normal. Negative for fracture or focal  lesion. Sinuses: No significant paranasal sinus disease at the imaged levels. Orbits: No mass or acute finding Review of the MIP images confirms the above findings CTA NECK FINDINGS Aortic arch: Standard aortic branching. Atherosclerotic plaque within the visualized aortic arch and proximal major branch vessels of the neck. No hemodynamically significant innominate or proximal subclavian artery stenosis. Right carotid system: CCA and ICA patent within the neck. Mild soft and calcified plaque within the carotid bifurcation and proximal ICA. Resultant less than 50% stenosis within the proximal ICA. Tortuosity of the cervical ICA. Left carotid system: CCA and ICA patent within the neck without stenosis. Mild soft and calcified plaque within the carotid bifurcation and proximal ICA. Vertebral arteries: Right vertebral artery slightly dominant. Mild nonstenotic calcified plaque at the origin of the right vertebral artery. Mild atheromatous narrowing at the origin of the left vertebral artery. Skeleton:  Cervical spondylosis. No acute bony abnormality or aggressive osseous lesion. Other neck: No neck mass or cervical lymphadenopathy. Thyroid unremarkable. Upper chest: No consolidation within the imaged lung apices. Review of the MIP images confirms the above findings CTA HEAD FINDINGS Anterior circulation: The intracranial internal carotid arteries are patent. The M1 middle cerebral arteries are patent. No M2 proximal branch occlusion or high-grade proximal stenosis is identified. The A1 left anterior cerebral artery is developmentally hypoplastic or absent. The anterior cerebral arteries are otherwise patent. No intracranial aneurysm is identified. Posterior circulation: The intracranial vertebral arteries are patent. The basilar artery is patent. The posterior cerebral arteries are patent. Atherosclerotic irregularity of the posterior cerebral arteries bilaterally. Most notably, there is a severe stenosis within the proximal left PCA at the P1/P2 junction (series 15, image 53). Posterior communicating arteries are hypoplastic or absent bilaterally. Venous sinuses: Within the limitations of contrast timing, no convincing thrombus. Anatomic variants: Posterior communicating arteries are hypoplastic or absent bilaterally. Review of the MIP images confirms the above findings IMPRESSION: CT head: 1. Small acute cortically-based infarct questioned within the mid to posterior right frontal lobe (measuring approximately 10 mm). Consider brain MRI for further evaluation. 2. Mild cerebral atrophy and chronic small vessel ischemic disease. CTA neck: 1. The common carotid and internal carotid arteries are patent within the neck without hemodynamically significant stenosis. Mild atherosclerotic plaque within the carotid bifurcations and proximal ICAs, bilaterally. 2. Vertebral arteries patent within the neck. Mild atherosclerotic narrowing at the origin of both vessels. CTA head: 1. No intracranial large vessel occlusion. 2.  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 3. Calcified plaque within the intracranial internal carotid arteries with sites of mild stenosis, bilaterally. Electronically Signed   By: Jackey Loge DO   On: 04/26/2020 20:42   CT Angio Neck W and/or Wo Contrast  Result Date: 04/26/2020 CLINICAL DATA:  Neuro deficit, acute, stroke suspected. Additional history provided: Patient reports left arm numbness from elbow to left fingers starting this morning, blurred vision, patient's daughter reports unstable gait, unknown last normal. EXAM: CT ANGIOGRAPHY HEAD AND NECK TECHNIQUE: Multidetector CT imaging of the head and neck was performed using the standard protocol during bolus administration of intravenous contrast. Multiplanar CT image reconstructions and MIPs were obtained to evaluate the vascular anatomy. Carotid stenosis measurements (when applicable) are obtained utilizing NASCET criteria, using the distal internal carotid diameter as the denominator. CONTRAST:  OMNIPAQUE IOHEXOL 350 MG/ML SOLN COMPARISON:  No pertinent prior exams available for comparison. FINDINGS: CT HEAD FINDINGS Brain: Mild cerebral atrophy. Mild ill-defined hypoattenuation within the cerebral white matter  is nonspecific, but compatible with chronic small vessel ischemic disease. A small acute cortical/subcortical infarct (measuring approximately 10 mm) is questioned within the mid to posterior right frontal lobe (for instance as seen on series 4, image 25) (series 8, images 49 and 50). There is no acute intracranial hemorrhage. No extra-axial fluid collection. No evidence of intracranial mass. No midline shift. Vascular: No hyperdense vessel.  Atherosclerotic calcifications Skull: Normal. Negative for fracture or focal lesion. Sinuses: No significant paranasal sinus disease at the imaged levels. Orbits: No mass or acute finding Review of the MIP images confirms the above findings CTA NECK  FINDINGS Aortic arch: Standard aortic branching. Atherosclerotic plaque within the visualized aortic arch and proximal major branch vessels of the neck. No hemodynamically significant innominate or proximal subclavian artery stenosis. Right carotid system: CCA and ICA patent within the neck. Mild soft and calcified plaque within the carotid bifurcation and proximal ICA. Resultant less than 50% stenosis within the proximal ICA. Tortuosity of the cervical ICA. Left carotid system: CCA and ICA patent within the neck without stenosis. Mild soft and calcified plaque within the carotid bifurcation and proximal ICA. Vertebral arteries: Right vertebral artery slightly dominant. Mild nonstenotic calcified plaque at the origin of the right vertebral artery. Mild atheromatous narrowing at the origin of the left vertebral artery. Skeleton: Cervical spondylosis. No acute bony abnormality or aggressive osseous lesion. Other neck: No neck mass or cervical lymphadenopathy. Thyroid unremarkable. Upper chest: No consolidation within the imaged lung apices. Review of the MIP images confirms the above findings CTA HEAD FINDINGS Anterior circulation: The intracranial internal carotid arteries are patent. The M1 middle cerebral arteries are patent. No M2 proximal branch occlusion or high-grade proximal stenosis is identified. The A1 left anterior cerebral artery is developmentally hypoplastic or absent. The anterior cerebral arteries are otherwise patent. No intracranial aneurysm is identified. Posterior circulation: The intracranial vertebral arteries are patent. The basilar artery is patent. The posterior cerebral arteries are patent. Atherosclerotic irregularity of the posterior cerebral arteries bilaterally. Most notably, there is a severe stenosis within the proximal left PCA at the P1/P2 junction (series 15, image 53). Posterior communicating arteries are hypoplastic or absent bilaterally. Venous sinuses: Within the limitations of  contrast timing, no convincing thrombus. Anatomic variants: Posterior communicating arteries are hypoplastic or absent bilaterally. Review of the MIP images confirms the above findings IMPRESSION: CT head: 1. Small acute cortically-based infarct questioned within the mid to posterior right frontal lobe (measuring approximately 10 mm). Consider brain MRI for further evaluation. 2. Mild cerebral atrophy and chronic small vessel ischemic disease. CTA neck: 1. The common carotid and internal carotid arteries are patent within the neck without hemodynamically significant stenosis. Mild atherosclerotic plaque within the carotid bifurcations and proximal ICAs, bilaterally. 2. Vertebral arteries patent within the neck. Mild atherosclerotic narrowing at the origin of both vessels. CTA head: 1. No intracranial large vessel occlusion. 2. 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 3. Calcified plaque within the intracranial internal carotid arteries with sites of mild stenosis, bilaterally. Electronically Signed   By: Jackey LogeKyle  Golden DO   On: 04/26/2020 20:42   MR BRAIN WO CONTRAST  Result Date: 04/27/2020 CLINICAL DATA:  Left arm numbness EXAM: MRI HEAD WITHOUT CONTRAST TECHNIQUE: Multiplanar, multiecho pulse sequences of the brain and surrounding structures were obtained without intravenous contrast. COMPARISON:  None. FINDINGS: Brain: Multifocal abnormal diffusion restriction within the right MCA territory. No acute or chronic hemorrhage. There is multifocal hyperintense T2-weighted signal within  the white matter. Parenchymal volume and CSF spaces are normal. The midline structures are normal. Vascular: Major flow voids are preserved. Skull and upper cervical spine: Normal calvarium and skull base. Visualized upper cervical spine and soft tissues are normal. Sinuses/Orbits:No paranasal sinus fluid levels or advanced mucosal thickening. No mastoid or middle ear effusion. Normal  orbits. IMPRESSION: Multifocal acute ischemia within the right MCA territory. No hemorrhage or mass effect. Electronically Signed   By: Deatra Robinson M.D.   On: 04/27/2020 00:52    Assessment: 70 year old male with a cluster of multifocal acute ischemic infarctions within the right MCA territory cortex and subcortical white matter 1. Exam reveals subtle left sided motor findings.  2. CT head: Small acute cortically-based infarct questioned within the mid to posterior right frontal lobe (measuring approximately 10 mm). Consider brain MRI for further evaluation. 2. Mild cerebral atrophy and chronic small vessel ischemic disease. 3. CTA neck: The common carotid and internal carotid arteries are patent within the neck without hemodynamically significant stenosis. Mild atherosclerotic plaque within the carotid bifurcations and proximal ICAs, bilaterally. Vertebral arteries patent within the neck. Mild atherosclerotic narrowing at the origin of both vessels. 4. CTA head: No intracranial large vessel occlusion. 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 Calcified plaque within the intracranial internal carotid arteries with sites of mild stenosis, bilaterally. 5. MRI brain: Multifocal acute ischemia within the right MCA territory. No hemorrhage or mass effect.  Recommendations: 1. HgbA1c, fasting lipid panel 2. TTE 3. Cardiac telemetry 4. PT consult, OT consult, Speech consult 5. Start 40 mg po qd atorvastatin 6. Prophylactic therapy- Start ASA 81 mg po qd:  7. Risk factor modification 8. Permissive HTN x 24 hours 9. Frequent neuro checks 10 NPO until passes stroke swallow screen   Electronically signed: Dr. Caryl Pina 04/27/2020, 1:27 AM

## 2020-04-27 NOTE — ED Provider Notes (Signed)
Patient transferred to [facility] from [facility] for MRI of brain to evaluate for possible stroke.  Patient had left arm numbness that started yesterday with associated blurred vision.  Neurology was contacted by Dr. Charm Barges, who recommended transfer to [facility] for MRI and neurology evaluation.  MRI shows multifocal acute ischemia in the right MCA territory.  Patient was discussed with Dr. Otelia Limes, who will evaluate the patient and requests hospitalist admission.  I discussed the results with the patient and family, who are agreeable with plan for admission.  Appreciate Dr. Rachael Darby for admitting.   Roxy Horseman, PA-C 04/27/20 0140    Glynn Octave, MD 04/27/20 [phone]

## 2020-04-27 NOTE — Progress Notes (Addendum)
STROKE TEAM PROGRESS NOTE   INTERVAL HISTORY  70 y.o. male with no significant past medical history who developed acute onset of left arm numbness and weakness from his elbow to his fingers yesterday.  Shortly after he developed blurry vision and unsteady gait.  MRI revealed acute R MCA ischemic infarctions.    Vitals:   04/27/20 0700 04/27/20 0800 04/27/20 0900 04/27/20 1100  BP: (!) 153/85 (!) 158/87 (!) 146/76 (!) 162/87  Pulse: 76 60 (!) 59 70  Resp: (!) 21 17 19 18   Temp:      TempSrc:      SpO2: 94% 93% 93% 93%  Weight:       CBC:  Recent Labs  Lab 04/26/20 1900  WBC 10.5  NEUTROABS 6.9  HGB 15.0  HCT 43.2  MCV 89.3  PLT 219   Basic Metabolic Panel:  Recent Labs  Lab 04/26/20 1900  NA 136  K 3.7  CL 100  CO2 26  GLUCOSE 96  BUN 10  CREATININE 0.72  CALCIUM 8.9   Lipid Panel:  Recent Labs  Lab 04/27/20 0411  CHOL 194  TRIG 99  HDL 42  CHOLHDL 4.6  VLDL 20  LDLCALC 04/29/20*   HgbA1c: No results for input(s): HGBA1C in the last 168 hours. Urine Drug Screen:  Recent Labs  Lab 04/26/20 2013  LABOPIA NONE DETECTED  COCAINSCRNUR NONE DETECTED  LABBENZ NONE DETECTED  AMPHETMU NONE DETECTED  THCU NONE DETECTED  LABBARB NONE DETECTED    Alcohol Level  Recent Labs  Lab 04/26/20 2012  ETH <10    IMAGING past 24 hours  CT Angio Neck W and/or Wo Contrast  Result Date: 04/26/2020  IMPRESSION: CT head:  1. Small acute cortically-based infarct questioned within the mid to posterior right frontal lobe (measuring approximately 10 mm). Consider brain MRI for further evaluation.  2. Mild cerebral atrophy and chronic small vessel ischemic disease.   CTA neck:  1. The common carotid and internal carotid arteries are patent within the neck without hemodynamically significant stenosis. Mild atherosclerotic plaque within the carotid bifurcations and proximal ICAs, bilaterally.  2. Vertebral arteries patent within the neck. Mild atherosclerotic narrowing at the  origin of both vessels.   CTA head:  1. No intracranial large vessel occlusion.  2. 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  3. Calcified plaque within the intracranial internal carotid arteries with sites of mild stenosis, bilaterally  MR BRAIN WO CONTRAST  Result Date: 04/27/2020 IMPRESSION: Multifocal acute ischemia within the right MCA territory. No hemorrhage or mass effect.   PHYSICAL EXAM Physical Exam  HEENT-  [place]/AT   Lungs- Respirations unlabored Extremities- Warm and well perfused  Neurological Examination Mental Status: Awake and alert. Oriented to person, place, time, situation, and person. Pleasant and cooperative. Slight delay of speech but fluent with intact naming and comprehension.  Cranial Nerves: II: Temporal visual fields intact with no extinction to DSS. PERRL.   III,IV, VI: No ptosis. EOMI.  V,VII: Smile symmetric, facial temp sensation equal bilaterally VIII: Hearing intact to voice IX,X: No hypophonia XI: Symmetric XII: Midline tongue extension Motor: LUE 4+/5 with slight pronator drift LLE 4+/5  No drift RUE and RLE 5/5 Sensory: Temp and light touch intact throughout, bilaterally. No extinction to DSS.  Cerebellar: Mild ataxia with FNF on the left. Normal on the right.  Gait: Deferred  ASSESSMENT/PLAN Mr. Ante Arredondo is a 71 y.o. male with history of prediabetes presenting with left  arm numbness from elbow to fingers, blurry vision, and unstable gait.   Stroke:  Multifocal acute right MCA infarcts likely due to cardioembolic source  CTA head: Severe stenosis within the proximal left posterior cerebral artery at the P1/P2 junction  CTA neck mild atherosclerotic plaque within the carotid bifurcations and proximal ICAs  MRI: multifocal acute ischemia within the right MCA territory  2D Echo EF 60-65%  UDS: negative  LDL 132  HgbA1c 5.7  VTE prophylaxis - SCDs Diet NPO  No  antithrombotic prior to admission, now on aspirin 81 mg daily and clopidogrel 75 mg daily. DAPT for 3 weeks then Aspirin alone  Will need loop recorder as an outpatient  Therapy recommendations:  outpt PT  Disposition:  home  Hypertension  Home meds:  none  Stable . Long-term BP goal normotensive  Hyperlipidemia  Home meds:  none  LDL 132, goal < 70  Add atorvastatin 40mg  daily  Continue statin at discharge  Other Stroke Risk Factors  Advanced Age >/= 70   Obesity, Body mass index is 36.41 kg/m., BMI >/= 30 associated with increased stroke risk, recommend weight loss, diet and exercise as appropriate    Hospital day # 0  Lissy Olivencia-Simmons, ACNP-BC   ATTENDING NOTE: I reviewed above note and agree with the assessment and plan. Pt was seen and examined.   CT 41-year-old male with possible prediabetes admitted for left arm and leg numbness, slow speaking, blurry vision and gait imbalance.  MRI showed a right MCA and MCA/ACA border zone infarct.  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  EF 60 to 65%.  LDL 132, A1c 5.7.  Creatinine 0.72.  On exam, patient awake alert, just finished lunch.  Daughter at bedside.  Follow simple commands, per daughter no significant language deficit although may be mildly slow but normal.  Cranial nerves intact, moving all extremities, sensation symmetrical.  No ataxia.  Etiology for patient stroke not quite clear.  Concerning for cardioembolic source.  Recommend loop recorder to be done as outpatient, however patient declined but his daughter would like to persuade him to proceed.  We will schedule as outpatient with Dr. 8.  Currently on aspirin 81 and Plavix 75 DAPT for 3 weeks and then aspirin alone.  Add Lipitor 40.  PT/OT recommend outpatient PT.  Stroke risk factor modification.  For detailed assessment and plan, please refer to above as I have made changes wherever appropriate.   Neurology will sign off. Please call with  questions. Pt will follow up with stroke clinic NP at [facility] in about 4 weeks. Thanks for the consult.   [facility], MD PhD Stroke Neurology 04/27/2020 4:13 PM     To contact Stroke Continuity provider, please refer to 04/29/2020. After hours, contact General Neurology

## 2020-04-27 NOTE — Evaluation (Signed)
Physical Therapy Evaluation Patient Details Name: Randy Robertson MRN: [id number] DOB: Mar 01, 1950 Today's Date: 04/27/2020   History of Present Illness  Randy Robertson is a 70 y.o. male with no significant past medical history who presents 4/21 from [facility]  ER for stroke work-up due to left arm weakness and unsteady gait.  BJS:[id number] acute ischemia within the right MCA territory.  Clinical Impression  Pt admitted secondary to problem above with deficits below. Pt with unsteadiness and tended to drift to the L. Requiring min to min guard A for mobility tasks at home Improved steadiness noted with use of RW. Educated about using RW at home to increase safety. Feel he would benefit from outpatient PT at d/c to address balance deficits. Will continue to follow acutely.      Follow Up Recommendations Outpatient PT (neuro outpatient)    Equipment Recommendations  Rolling walker with 5" wheels;Other (comment) (shower seat)    Recommendations for Other Services       Precautions / Restrictions Precautions Precautions: Fall Restrictions Weight Bearing Restrictions: No      Mobility  Bed Mobility Overal bed mobility: Needs Assistance Bed Mobility: Supine to Sit;Sit to Supine     Supine to sit: Supervision Sit to supine: Supervision   General bed mobility comments: Supervision for safety.    Transfers Overall transfer level: Needs assistance Equipment used: Rolling walker (2 wheeled) Transfers: Sit to/from Stand Sit to Stand: Min guard         General transfer comment: Min guard for safety.  Ambulation/Gait Ambulation/Gait assistance: Min guard;Min assist;+2 safety/equipment Gait Distance (Feet): 150 Feet Assistive device: Rolling walker (2 wheeled);1 person hand held assist Gait Pattern/deviations: Step-through pattern;Decreased stride length;Staggering left Gait velocity: Decreased   General Gait Details: Pt tended to drift to the L. Noted LOB when performing L  lateral head turn. Min A for steadying when ambulating without AD. Noted improved balance with use of RW. Educated about using at home for increased safety.  Stairs            Wheelchair Mobility    Modified Rankin (Stroke Patients Only) Modified Rankin (Stroke Patients Only) Pre-Morbid Rankin Score: No symptoms Modified Rankin: Moderately severe disability     Balance Overall balance assessment: Needs assistance Sitting-balance support: No upper extremity supported Sitting balance-Leahy Scale: Good     Standing balance support: Bilateral upper extremity supported Standing balance-Leahy Scale: Poor Standing balance comment: Reliant on BUE support                             Pertinent Vitals/Pain Pain Assessment: No/denies pain    Home Living Family/patient expects to be discharged to:: Private residence Living Arrangements: Spouse/significant other Available Help at Discharge: Family;Available 24 hours/day Type of Home: House Home Access: Level entry     Home Layout: One level Home Equipment: None      Prior Function Level of Independence: Independent               Hand Dominance        Extremity/Trunk Assessment   Upper Extremity Assessment Upper Extremity Assessment: Defer to OT evaluation    Lower Extremity Assessment Lower Extremity Assessment: Generalized weakness    Cervical / Trunk Assessment Cervical / Trunk Assessment: Normal  Communication   Communication: Prefers language other than English (Daughter prefers to interpret and pt requesting her to interpret)  Cognition Arousal/Alertness: Awake/alert Behavior During Therapy: WFL for tasks assessed/performed Overall Cognitive Status:  Difficult to assess                                 General Comments: Difficulty following commands for vision testing (even with dtr interpreting)      General Comments General comments (skin integrity, edema, etc.): Pt's  daughter present throughout session    Exercises     Assessment/Plan    PT Assessment Patient needs continued PT services  PT Problem List Decreased strength;Decreased balance;Decreased mobility;Decreased cognition;Decreased knowledge of use of DME;Decreased safety awareness;Decreased knowledge of precautions       PT Treatment Interventions Gait training;DME instruction;Functional mobility training;Therapeutic activities;Therapeutic exercise;Balance training;Patient/family education    PT Goals (Current goals can be found in the Care Plan section)  Acute Rehab PT Goals Patient Stated Goal: to go home PT Goal Formulation: With patient Time For Goal Achievement: 05/11/20 Potential to Achieve Goals: Good    Frequency Min 4X/week   Barriers to discharge        Co-evaluation PT/OT/SLP Co-Evaluation/Treatment: Yes Reason for Co-Treatment: For patient/therapist safety;To address functional/ADL transfers PT goals addressed during session: Mobility/safety with mobility;Balance;Proper use of DME OT goals addressed during session: Strengthening/ROM;ADL's and self-care       AM-PAC PT "6 Clicks" Mobility  Outcome Measure Help needed turning from your back to your side while in a flat bed without using bedrails?: None Help needed moving from lying on your back to sitting on the side of a flat bed without using bedrails?: None Help needed moving to and from a bed to a chair (including a wheelchair)?: A Little Help needed standing up from a chair using your arms (e.g., wheelchair or bedside chair)?: A Little Help needed to walk in hospital room?: A Little Help needed climbing 3-5 steps with a railing? : A Lot 6 Click Score: 19    End of Session Equipment Utilized During Treatment: Gait belt Activity Tolerance: Patient tolerated treatment well Patient left: in bed;with call bell/phone within reach;with family/visitor present (on stretcher in ED) Nurse Communication: Mobility  status PT Visit Diagnosis: Unsteadiness on feet (R26.81);Muscle weakness (generalized) (M62.81)    Time: 4174-0814 PT Time Calculation (min) (ACUTE ONLY): 28 min   Charges:   PT Evaluation $PT Eval Low Complexity: 1 Low          Cindee Salt, DPT  Acute Rehabilitation Services  Pager: [phone] Office: [phone]   Lehman Prom 04/27/2020, 4:06 PM

## 2020-04-27 NOTE — Progress Notes (Signed)
Pt seen and evaluated. Recommending outpatient neuro PT and RW and shower chair. Formal note to follow.   Farley Ly, PT, DPT  Acute Rehabilitation Services  Pager: [phone] Office: [phone]

## 2020-04-27 NOTE — ED Notes (Addendum)
Moved to room 32. Neurologist @ bedside talking w/ pt.

## 2020-04-27 NOTE — ED Notes (Signed)
Pt ambulatory with steady gait using walker. NADN. Daughter at bedside.

## 2020-05-14 ENCOUNTER — Ambulatory Visit: Payer: Medicare HMO | Attending: Internal Medicine

## 2020-05-14 ENCOUNTER — Other Ambulatory Visit: Payer: Self-pay

## 2020-05-14 DIAGNOSIS — R2681 Unsteadiness on feet: Secondary | ICD-10-CM | POA: Insufficient documentation

## 2020-05-14 DIAGNOSIS — I69354 Hemiplegia and hemiparesis following cerebral infarction affecting left non-dominant side: Secondary | ICD-10-CM | POA: Diagnosis not present

## 2020-05-14 DIAGNOSIS — R2689 Other abnormalities of gait and mobility: Secondary | ICD-10-CM | POA: Insufficient documentation

## 2020-05-15 NOTE — Patient Instructions (Signed)
Ambulation with cane 4x daily

## 2020-05-15 NOTE — Therapy (Signed)
[facility] [address] [place], [place], [address] Phone: [phone]   Fax:  [phone]  Physical Therapy Evaluation  Patient Details  Name: Randy Robertson MRN: [id number] Date of Birth: 1950-01-13 Referring Provider (PT): Otilio Carpen MD   Encounter Date: 05/14/2020   PT End of Session - 05/14/20 1620    Visit Number 1    Number of Visits 16    Date for PT Re-Evaluation 07/03/20    Authorization Type [employer]    PT Start Time 1615    PT Stop Time 1700    PT Time Calculation (min) 45 min    Equipment Utilized During Treatment Gait belt    Activity Tolerance Patient tolerated treatment well    Behavior During Therapy [facility] for tasks assessed/performed           History reviewed. No pertinent past medical history.  Past Surgical History:  Procedure Laterality Date  . NO PAST SURGERIES      There were no vitals filed for this visit.      05/14/20 0001  Assessment  Medical Diagnosis CVA  Referring Provider (PT) Otilio Carpen MD  Onset Date/Surgical Date 04/26/20  Hand Dominance Right  Next MD Visit 06/26/20 Neuro  Prior Therapy none  Precautions  Precautions Fall  Balance Screen  Has the patient fallen in the past 6 months No  Has the patient had a decrease in activity level because of a fear of falling?  Yes  Is the patient reluctant to leave their home because of a fear of falling?  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  Coordination  Gross Motor Movements are Fluid and Coordinated Yes  ROM / Strength  AROM / PROM / Strength Strength  Strength  Overall Strength Deficits  Overall Strength Comments RLE strength 4+/5  Right/Left Knee Left  Right/Left Ankle Left  Left Hip Flexion 4/5  Left Hip Extension 4/5  Left Hip ABduction 4/5  Left Hip ADduction 4/5  Left  Knee Flexion 4/5  Left Knee Extension 4/5  Left Ankle Dorsiflexion 4/5  Left Ankle Plantar Flexion 4/5  Transfers  Transfers Sit to Stand  Five time sit to stand comments  22.16  Comments favored LLE  Ambulation/Gait  Gait velocity .55 m/s  Timed Up and Go Test  Normal TUG (seconds) 19.02  High Level Balance  High Level Balance Comments Able to hold all 4 positions on MCTSIB for 30s                   Objective measurements completed on examination: See above findings.               PT Education - 05/15/20 1019    Education Details Discussed Eval findings, prognosis and POC and is in agreement    Person(s) Educated Patient    Methods Explanation    Comprehension Verbalized understanding            PT Short Term Goals - 05/15/20 1032      PT SHORT TERM GOAL #1   Title patient to demo initial HEP back to PT    Baseline TBD    Time 4    Period Weeks    Status New    Target Date 06/19/20      PT SHORT TERM GOAL #2   Title Assess BERG and establish appropriate goal  Baseline TBD    Time 4    Period Weeks    Status New    Target Date 06/19/20      PT SHORT TERM GOAL #3   Title Assess FGA/DGI and set appropriate goal    Baseline TBD    Time 5    Period Weeks    Status New    Target Date 06/19/20      PT SHORT TERM GOAL #4   Title Patient to ambulate 531ft w/o need of AD with SBA across outdoor surfaces    Baseline 173ft in clinic under SBA w/o need of AD    Time 5    Period Weeks    Status New    Target Date 06/19/20      PT SHORT TERM GOAL #5   Title Patient to negotiate full flight of stairs with most appropriate pattern and support    Baseline 4 steps with single rail and step to pattern    Time 5    Period Weeks    Status New    Target Date 06/19/20             PT Long Term Goals - 05/15/20 1036      PT LONG TERM GOAL #1   Title patient to demo final HEP back to PT    Baseline TBD    Time 9    Period Weeks     Status New    Target Date 07/17/20      PT LONG TERM GOAL #2   Title Patient to increase FOTO score to 65    Baseline Initial FOTO score 54    Time 9    Period Weeks    Status New    Target Date 07/17/20      PT LONG TERM GOAL #3   Title Patient to demo gait velocity of 0.8 m/s w/o AD    Baseline 0.55 m/s w/o AD    Time 9    Period Weeks    Status New    Target Date 07/17/20      PT LONG TERM GOAL #4   Title Patient to ambulate 1040ft across outdoor surfaces w/o AD under S    Baseline 167ft across leel indoor surface w/o AD under SBA    Time 9    Period Weeks    Status New    Target Date 07/17/20      PT LONG TERM GOAL #5   Title Patient to perform TUG w/o AD in 13s    Baseline Initial TUG w/o AD 19.02s    Time 9    Period Weeks    Status New    Target Date 07/17/20      Additional Long Term Goals   Additional Long Term Goals Yes      PT LONG TERM GOAL #6   Title patient to perform 5x STS test w/o UE support in 15s    Baseline 5x STS score w/o UE assist 22.16s    Time 9    Period Weeks    Status New    Target Date 07/17/20                  Plan - 05/15/20 1025    Clinical Impression Statement Patient referred to OPPT following CVA, he reports significant progress since onset and ha weaned off of ADs, he is ambulating 4x daily w/o cane, denies pain but reports balance and LLE strength deficits, he is  able to transfer I and is I in bed mobility, he presents with mild strength deficits throughout LLE, demos and decreased gait velocity and increased TUG and 5x STS times.  He is able to ambulate in clinic under SBA w/o need of AD with decreased cadence but no LOB. Patient is a good candidate for OPPT.    Personal Factors and Comorbidities Comorbidity 1    Comorbidities CVA    Examination-Activity Limitations Locomotion Level;Stairs    Examination-Participation Restrictions Occupation    Stability/Clinical Decision Making Stable/Uncomplicated    Clinical  Decision Making Low    Rehab Potential Good    PT Frequency 2x / week    PT Duration 8 weeks    PT Treatment/Interventions ADLs/Self Care Home Management;Aquatic Therapy;Gait training;Stair training;Functional mobility training;Therapeutic activities;Therapeutic exercise;Balance training;Neuromuscular re-education;Patient/family education    PT Next Visit Plan Assess BERG and FGA/DGI    PT Home Exercise Plan instructed to continue daiy ambulation with cane    Recommended Other Services OT discussed but declined at this time    Consulted and Agree with Plan of Care Patient           Patient will benefit from skilled therapeutic intervention in order to improve the following deficits and impairments:  Abnormal gait,Difficulty walking,Decreased endurance,Decreased activity tolerance,Decreased balance,Decreased mobility,Decreased strength  Visit Diagnosis: Unsteadiness on feet  Other abnormalities of gait and mobility  Hemiplegia and hemiparesis following cerebral infarction affecting left non-dominant side [facility])     Problem List Patient Active Problem List   Diagnosis Date Noted  . Stroke (HCC) 04/27/2020  . Elevated blood-pressure reading without diagnosis of hypertension 04/27/2020    Hildred Laser PT 05/15/2020, 10:43 AM  [facility] [address] [place], [place], [address] Phone: [phone]   Fax:  [phone]  Name: Leotis Isham MRN: [id number] Date of Birth: September 12, 1950

## 2020-05-21 ENCOUNTER — Other Ambulatory Visit: Payer: Self-pay

## 2020-05-21 ENCOUNTER — Encounter: Payer: Self-pay | Admitting: Internal Medicine

## 2020-05-21 ENCOUNTER — Ambulatory Visit (INDEPENDENT_AMBULATORY_CARE_PROVIDER_SITE_OTHER): Payer: Medicare HMO | Admitting: Internal Medicine

## 2020-05-21 VITALS — BP 152/84 | HR 71 | Ht 71.0 in | Wt 248.6 lb

## 2020-05-21 DIAGNOSIS — I639 Cerebral infarction, unspecified: Secondary | ICD-10-CM

## 2020-05-22 NOTE — Progress Notes (Signed)
Visit to be rescheduled due to provider illness

## 2020-05-23 ENCOUNTER — Other Ambulatory Visit: Payer: Self-pay

## 2020-05-23 ENCOUNTER — Ambulatory Visit: Payer: Medicare HMO

## 2020-05-23 DIAGNOSIS — I69354 Hemiplegia and hemiparesis following cerebral infarction affecting left non-dominant side: Secondary | ICD-10-CM | POA: Diagnosis not present

## 2020-05-23 DIAGNOSIS — R2681 Unsteadiness on feet: Secondary | ICD-10-CM | POA: Diagnosis not present

## 2020-05-23 DIAGNOSIS — R2689 Other abnormalities of gait and mobility: Secondary | ICD-10-CM

## 2020-05-24 ENCOUNTER — Ambulatory Visit: Payer: Medicare HMO

## 2020-05-24 DIAGNOSIS — I69354 Hemiplegia and hemiparesis following cerebral infarction affecting left non-dominant side: Secondary | ICD-10-CM

## 2020-05-24 DIAGNOSIS — R2689 Other abnormalities of gait and mobility: Secondary | ICD-10-CM | POA: Diagnosis not present

## 2020-05-24 DIAGNOSIS — R2681 Unsteadiness on feet: Secondary | ICD-10-CM

## 2020-05-24 NOTE — Therapy (Signed)
[place] [address] [place], [place], [address] Phone: [phone]   Fax:  [phone]  Physical Therapy Treatment  Patient Details  Name: Randy Robertson MRN: [id number] Date of Birth: Dec 01, 1950 Referring Provider (PT): Fredia Beets MD   Encounter Date: 05/23/2020   PT End of Session - 05/23/20 0738    Visit Number 2    Number of Visits 16    Date for PT Re-Evaluation 07/03/20    Authorization Type [employer]    Progress Note Due on Visit 10    PT Start Time 1615    PT Stop Time 1700    PT Time Calculation (min) 45 min    Equipment Utilized During Treatment Gait belt    Activity Tolerance Patient tolerated treatment well    Behavior During Therapy [facility] for tasks assessed/performed           History reviewed. No pertinent past medical history.  Past Surgical History:  Procedure Laterality Date  . NO PAST SURGERIES      There were no vitals filed for this visit.   Subjective Assessment - 05/24/20 0736    Subjective no new c/o    Patient is accompained by: Interpreter    Patient Stated Goals To walk better    Currently in Pain? No/denies              [facility] PT Assessment - 05/23/20 0001      Berg Balance Test   Sit to Stand Able to stand  independently using hands    Standing Unsupported Able to stand 2 minutes with supervision    Sitting with Back Unsupported but Feet Supported on Floor or Stool Able to sit safely and securely 2 minutes    Stand to Sit Sits safely with minimal use of hands    Transfers Able to transfer safely, minor use of hands    Standing Unsupported with Eyes Closed Able to stand 10 seconds with supervision    Standing Unsupported with Feet Together Able to place feet together independently and stand for 1 minute with supervision    From Standing, Reach Forward with Outstretched Arm Can reach forward >12 cm safely (5")    From Standing Position, Pick up Object from  Floor Able to pick up shoe, needs supervision    From Standing Position, Turn to Look Behind Over each Shoulder Looks behind from both sides and weight shifts well    Turn 360 Degrees Able to turn 360 degrees safely but slowly    Standing Unsupported, Alternately Place Feet on Step/Stool Able to stand independently and complete 8 steps >20 seconds    Standing Unsupported, One Foot in Front Able to plae foot ahead of the other independently and hold 30 seconds    Standing on One Leg Tries to lift leg/unable to hold 3 seconds but remains standing independently    Total Score 43      Functional Gait  Assessment   Gait assessed  Yes    Gait Level Surface Walks 20 ft, slow speed, abnormal gait pattern, evidence for imbalance or deviates 10-15 in outside of the 12 in walkway width. Requires more than 7 sec to ambulate 20 ft.    Change in Gait Speed Makes only minor adjustments to walking speed, or accomplishes a change in speed with significant gait deviations, deviates 10-15 in outside the 12 in walkway width, or changes speed but loses balance but is able to recover and continue walking.  Gait with Horizontal Head Turns Performs head turns with moderate changes in gait velocity, slows down, deviates 10-15 in outside 12 in walkway width but recovers, can continue to walk.    Gait with Vertical Head Turns Performs task with moderate change in gait velocity, slows down, deviates 10-15 in outside 12 in walkway width but recovers, can continue to walk.    Gait with Narrow Base of Support Ambulates 7-9 steps.    Gait with Eyes Closed Walks 20 ft, slow speed, abnormal gait pattern, evidence for imbalance, deviates 10-15 in outside 12 in walkway width. Requires more than 9 sec to ambulate 20 ft.            Instructed in 5x STS BID During todays session, additional time needed to explain and demo tasks due to language and patient slow movement patterns due to apprehension and  tension                     PT Education - 05/24/20 0737    Education Details 5x STS 2x/day    Person(s) Educated Patient    Methods Explanation;Demonstration    Comprehension Verbalized understanding;Returned demonstration            PT Short Term Goals - 05/23/20 0742      PT SHORT TERM GOAL #1   Title patient to demo initial HEP back to PT    Baseline 05/23/20 began 5x STS BID    Time 4    Period Weeks    Status On-going    Target Date 06/19/20      PT SHORT TERM GOAL #2   Title Assess BERG and establish appropriate goal    Baseline 05/23/20 BERG score 43, goal is 51    Time 4    Period Weeks    Status Achieved    Target Date 06/19/20      PT SHORT TERM GOAL #3   Title Assess FGA/DGI and set appropriate goal    Baseline FGA patially completed due to time constraints    Time 5    Period Weeks    Status Partially Met    Target Date 06/19/20      PT SHORT TERM GOAL #4   Title Patient to ambulate 587f w/o need of AD with SBA across outdoor surfaces    Baseline 101fin clinic under SBA w/o need of AD    Time 5    Period Weeks    Status New    Target Date 06/19/20      PT SHORT TERM GOAL #5   Title Patient to negotiate full flight of stairs with most appropriate pattern and support    Baseline 4 steps with single rail and step to pattern    Time 5    Period Weeks    Status New    Target Date 06/19/20             PT Long Term Goals - 05/15/20 1036      PT LONG TERM GOAL #1   Title patient to demo final HEP back to PT    Baseline TBD    Time 9    Period Weeks    Status New    Target Date 07/17/20      PT LONG TERM GOAL #2   Title Patient to increase FOTO score to 65    Baseline Initial FOTO score 54    Time 9    Period Weeks  Status New    Target Date 07/17/20      PT LONG TERM GOAL #3   Title Patient to demo gait velocity of 0.8 m/s w/o AD    Baseline 0.55 m/s w/o AD    Time 9    Period Weeks    Status New    Target  Date 07/17/20      PT LONG TERM GOAL #4   Title Patient to ambulate 1066f across outdoor surfaces w/o AD under S    Baseline 1053facross leel indoor surface w/o AD under SBA    Time 9    Period Weeks    Status New    Target Date 07/17/20      PT LONG TERM GOAL #5   Title Patient to perform TUG w/o AD in 13s    Baseline Initial TUG w/o AD 19.02s    Time 9    Period Weeks    Status New    Target Date 07/17/20      Additional Long Term Goals   Additional Long Term Goals Yes      PT LONG TERM GOAL #6   Title patient to perform 5x STS test w/o UE support in 15s    Baseline 5x STS score w/o UE assist 22.16s    Time 9    Period Weeks    Status New    Target Date 07/17/20                 Plan - 05/24/20 0739    Clinical Impression Statement Todays skilled session focused on continued assessment of balance and functiional mobility, patient was able to complete all requested tasks but at a slowed pace due to apprehension and tension.  BAlance deficts noted in both dynamic and static environments    Personal Factors and Comorbidities Comorbidity 1    Comorbidities CVA    Examination-Activity Limitations Locomotion Level;Stairs    Examination-Participation Restrictions Occupation    Stability/Clinical Decision Making Stable/Uncomplicated    Rehab Potential Good    PT Frequency 2x / week    PT Duration 8 weeks    PT Treatment/Interventions ADLs/Self Care Home Management;Aquatic Therapy;Gait training;Stair training;Functional mobility training;Therapeutic activities;Therapeutic exercise;Balance training;Neuromuscular re-education;Patient/family education    PT Next Visit Plan Finalize FGA testing and establish HEP    PT Home Exercise Plan 5x STS 2x/day    Consulted and Agree with Plan of Care Patient           Patient will benefit from skilled therapeutic intervention in order to improve the following deficits and impairments:  Abnormal gait,Difficulty walking,Decreased  endurance,Decreased activity tolerance,Decreased balance,Decreased mobility,Decreased strength  Visit Diagnosis: Unsteadiness on feet  Other abnormalities of gait and mobility  Hemiplegia and hemiparesis following cerebral infarction affecting left non-dominant side [facility]    Problem List Patient Active Problem List   Diagnosis Date Noted  . Stroke ([facility]/22/20[facility]  . Elevated blood-pressure reading without diagnosis of hypertension 04/27/2020    JeLanice ShirtsT 05/24/2020, 7:45 AM  [id number]: [phone] Fax:  [phone]: Randy GalyeanRN: [id number] of Birth: 2/September 29, 1950

## 2020-05-25 NOTE — Progress Notes (Signed)
   05/25/20 0001  Transfers  Transfers Sit to Stand  Comments performed 10 reps with arms crossed encouraging patient to weight shift forward onto feet and maintain a wide BOS  Ambulation/Gait  Ambulation/Gait Yes  Ambulation/Gait Assistance 5: Supervision;4: Min guard  Ambulation/Gait Assistance Details facilitated increased cadence, hip and trunk rotation and pstural correction  Ambulation Distance (Feet) 230 Feet  Assistive device None

## 2020-05-25 NOTE — Therapy (Signed)
[facility] [address] [place], [place], [address] Phone: [phone]   Fax:  [phone]  Physical Therapy Treatment  Patient Details  Name: Randy Robertson MRN: [id number] Date of Birth: 1950/06/22 Referring Provider (PT): Otilio Carpen MD   Encounter Date: 05/24/2020   PT End of Session - 05/24/20 0930    Visit Number 3    Number of Visits 16    Date for PT Re-Evaluation 07/03/20    Authorization Type [employer]    Progress Note Due on Visit 10    PT Start Time 1745    PT Stop Time 1830    PT Time Calculation (min) 45 min    Equipment Utilized During Treatment Gait belt    Activity Tolerance Patient tolerated treatment well    Behavior During Therapy [facility] for tasks assessed/performed           No past medical history on file.  Past Surgical History:  Procedure Laterality Date  . NO PAST SURGERIES      There were no vitals filed for this visit.   Subjective Assessment - 05/24/20 1750    Subjective No falls or changes, loses balance laterally    Patient is accompained by: Interpreter    Patient Stated Goals To walk better             05/25/20 0001  Transfers  Transfers Sit to Stand  Comments performed 10 reps with arms crossed encouraging patient to weight shift forward onto feet and maintain a wide BOS  Ambulation/Gait  Ambulation/Gait Yes  Ambulation/Gait Assistance 5: Supervision;4: Min guard  Ambulation/Gait Assistance Details facilitated increased cadence, hip and trunk rotation and pstural correction  Ambulation Distance (Feet) 230 Feet  Assistive device None       05/24/20 0001  Balance Exercises: Standing  Rockerboard Anterior/posterior;Lateral;30 seconds;Intermittent UE support;Limitations  Rockerboard Limitations started with rocking then advanced to static hold 30sx2  Balance Beam fwd/bwd tandem stepping across foam beam 3 trips in // bars starting with BUE support  weaning to single UE support  Sidestepping Foam/compliant support;3 reps;Limitations;Other (comment)  Sidestepping Limitations performed 3 trips in // bars weaning from BUE to single  Other Standing Exercises runner step from Airex 5x per side       05/24/20 0001  Functional Gait  Assessment  Gait assessed  Yes  Gait Level Surface 1  Change in Gait Speed 1  Gait with Horizontal Head Turns 1  Gait with Vertical Head Turns 1  Gait and Pivot Turn 2  Step Over Obstacle 1  Gait with Narrow Base of Support 2  Gait with Eyes Closed 1  Ambulating Backwards 2  Steps 1  Total Score 13                [facility] Adult PT Treatment/Exercise - 05/25/20 0001      Transfers   Transfers Sit to Stand    Comments performed 10 reps with arms crossed encouraging patient to weight shift forward onto feet and maintain a wide BOS      Ambulation/Gait   Ambulation/Gait Yes    Ambulation/Gait Assistance 5: Supervision;4: Min guard    Ambulation/Gait Assistance Details facilitated increased cadence, hip and trunk rotation and pstural correction    Ambulation Distance (Feet) 230 Feet    Assistive device None                    PT Short Term Goals - 05/24/20 0932  PT SHORT TERM GOAL #1   Title patient to demo initial HEP back to PT    Baseline 05/23/20 began 5x STS BID    Time 4    Period Weeks    Status On-going    Target Date 06/19/20      PT SHORT TERM GOAL #2   Title Assess BERG and establish appropriate goal    Baseline 05/23/20 BERG score 43, goal is 51    Time 4    Period Weeks    Status Achieved    Target Date 06/19/20      PT SHORT TERM GOAL #3   Title Assess FGA/DGI and set appropriate goal    Baseline FGA patially completed due to time constraints; 05/24/20 FGA score 13, goal is 21    Time 5    Period Weeks    Status Achieved    Target Date 06/19/20      PT SHORT TERM GOAL #4   Title Patient to ambulate 547ft w/o need of AD with SBA across outdoor  surfaces    Baseline 141ft in clinic under SBA w/o need of AD    Time 5    Period Weeks    Status New    Target Date 06/19/20      PT SHORT TERM GOAL #5   Title Patient to negotiate full flight of stairs with most appropriate pattern and support    Baseline 4 steps with single rail and step to pattern    Time 5    Period Weeks    Status New    Target Date 06/19/20             PT Long Term Goals - 05/15/20 1036      PT LONG TERM GOAL #1   Title patient to demo final HEP back to PT    Baseline TBD    Time 9    Period Weeks    Status New    Target Date 07/17/20      PT LONG TERM GOAL #2   Title Patient to increase FOTO score to 65    Baseline Initial FOTO score 54    Time 9    Period Weeks    Status New    Target Date 07/17/20      PT LONG TERM GOAL #3   Title Patient to demo gait velocity of 0.8 m/s w/o AD    Baseline 0.55 m/s w/o AD    Time 9    Period Weeks    Status New    Target Date 07/17/20      PT LONG TERM GOAL #4   Title Patient to ambulate 1050ft across outdoor surfaces w/o AD under S    Baseline 175ft across leel indoor surface w/o AD under SBA    Time 9    Period Weeks    Status New    Target Date 07/17/20      PT LONG TERM GOAL #5   Title Patient to perform TUG w/o AD in 13s    Baseline Initial TUG w/o AD 19.02s    Time 9    Period Weeks    Status New    Target Date 07/17/20      Additional Long Term Goals   Additional Long Term Goals Yes      PT LONG TERM GOAL #6   Title patient to perform 5x STS test w/o UE support in 15s    Baseline 5x STS score w/o  UE assist 22.16s    Time 9    Period Weeks    Status New    Target Date 07/17/20                  Patient will benefit from skilled therapeutic intervention in order to improve the following deficits and impairments:     Visit Diagnosis: Unsteadiness on feet  Other abnormalities of gait and mobility  Hemiplegia and hemiparesis following cerebral infarction affecting  left non-dominant side [facility])     Problem List Patient Active Problem List   Diagnosis Date Noted  . Stroke (HCC) 04/27/2020  . Elevated blood-pressure reading without diagnosis of hypertension 04/27/2020    Hildred Laser 05/25/2020, 9:40 AM  [facility] [address] [place], [place], [address] Phone: [phone]   Fax:  [phone]  Name: Randy Robertson MRN: [id number] Date of Birth: 03/05/1950

## 2020-05-27 DIAGNOSIS — Z760 Encounter for issue of repeat prescription: Secondary | ICD-10-CM | POA: Diagnosis not present

## 2020-05-28 ENCOUNTER — Other Ambulatory Visit: Payer: Self-pay

## 2020-05-28 ENCOUNTER — Ambulatory Visit: Payer: Medicare HMO

## 2020-05-28 DIAGNOSIS — I639 Cerebral infarction, unspecified: Secondary | ICD-10-CM

## 2020-05-28 DIAGNOSIS — R2689 Other abnormalities of gait and mobility: Secondary | ICD-10-CM | POA: Diagnosis not present

## 2020-05-28 DIAGNOSIS — I69354 Hemiplegia and hemiparesis following cerebral infarction affecting left non-dominant side: Secondary | ICD-10-CM | POA: Diagnosis not present

## 2020-05-28 DIAGNOSIS — R2681 Unsteadiness on feet: Secondary | ICD-10-CM

## 2020-05-29 NOTE — Therapy (Signed)
[facility] [address] [place], [place], [address] Phone: [phone]   Fax:  [phone]  Physical Therapy Treatment  Patient Details  Name: Randy Robertson MRN: [id number] Date of Birth: 08/26/1950 Referring Provider (PT): Otilio Carpen MD   Encounter Date: 05/28/2020   PT End of Session - 05/28/20 1629    Visit Number 4    Number of Visits 16    Date for PT Re-Evaluation 07/03/20    Authorization Type [employer]    Progress Note Due on Visit 10    PT Start Time 1625    PT Stop Time 1700    PT Time Calculation (min) 35 min    Equipment Utilized During Treatment Gait belt    Activity Tolerance Patient tolerated treatment well    Behavior During Therapy [facility] for tasks assessed/performed           History reviewed. No pertinent past medical history.  Past Surgical History:  Procedure Laterality Date  . NO PAST SURGERIES      There were no vitals filed for this visit.   Subjective Assessment - 05/28/20 1628    Subjective No falls to note, R LE tingling persists    Patient Stated Goals To walk better               05/28/20 0001  Transfers  Transfers Sit to Stand  Comments performed 10 reps with arms crossed encouraging patient to weight shift forward onto feet and maintain a wide BOS using Airex  Ambulation/Gait  Ambulation/Gait Yes  Ambulation/Gait Assistance 4: Min guard;5: Supervision  Ambulation Distance (Feet) 230 Feet  Assistive device None  Knee/Hip Exercises: Aerobic  Nustep L2 6' arms 10 30-40 steps per minute       05/28/20 0001  Balance Exercises: Standing  Rockerboard Anterior/posterior;Limitations  Rockerboard Limitations attempted alt arm swings but unable to stabilize, transitioned to airex instead  Step Over Hurdles / Cones stepping over 4 low profile hurdles in // bars starting with BUE support weaning to single for 4 trips  Other Standing Exercises standing on airex  lift ball OH 15x  Other Standing Exercises Comments runners step from low profile rocker board single UE support 10x per side                           PT Short Term Goals - 05/24/20 0932      PT SHORT TERM GOAL #1   Title patient to demo initial HEP back to PT    Baseline 05/23/20 began 5x STS BID    Time 4    Period Weeks    Status On-going    Target Date 06/19/20      PT SHORT TERM GOAL #2   Title Assess BERG and establish appropriate goal    Baseline 05/23/20 BERG score 43, goal is 51    Time 4    Period Weeks    Status Achieved    Target Date 06/19/20      PT SHORT TERM GOAL #3   Title Assess FGA/DGI and set appropriate goal    Baseline FGA patially completed due to time constraints; 05/24/20 FGA score 13, goal is 21    Time 5    Period Weeks    Status Achieved    Target Date 06/19/20      PT SHORT TERM GOAL #4   Title Patient to ambulate 586ft w/o need of AD with SBA  across outdoor surfaces    Baseline 119ft in clinic under SBA w/o need of AD    Time 5    Period Weeks    Status New    Target Date 06/19/20      PT SHORT TERM GOAL #5   Title Patient to negotiate full flight of stairs with most appropriate pattern and support    Baseline 4 steps with single rail and step to pattern    Time 5    Period Weeks    Status New    Target Date 06/19/20             PT Long Term Goals - 05/15/20 1036      PT LONG TERM GOAL #1   Title patient to demo final HEP back to PT    Baseline TBD    Time 9    Period Weeks    Status New    Target Date 07/17/20      PT LONG TERM GOAL #2   Title Patient to increase FOTO score to 65    Baseline Initial FOTO score 54    Time 9    Period Weeks    Status New    Target Date 07/17/20      PT LONG TERM GOAL #3   Title Patient to demo gait velocity of 0.8 m/s w/o AD    Baseline 0.55 m/s w/o AD    Time 9    Period Weeks    Status New    Target Date 07/17/20      PT LONG TERM GOAL #4   Title Patient  to ambulate 1059ft across outdoor surfaces w/o AD under S    Baseline 148ft across leel indoor surface w/o AD under SBA    Time 9    Period Weeks    Status New    Target Date 07/17/20      PT LONG TERM GOAL #5   Title Patient to perform TUG w/o AD in 13s    Baseline Initial TUG w/o AD 19.02s    Time 9    Period Weeks    Status New    Target Date 07/17/20      Additional Long Term Goals   Additional Long Term Goals Yes      PT LONG TERM GOAL #6   Title patient to perform 5x STS test w/o UE support in 15s    Baseline 5x STS score w/o UE assist 22.16s    Time 9    Period Weeks    Status New    Target Date 07/17/20                 Plan - 05/28/20 1703    Clinical Impression Statement Delayed in traffic by 10 min.  Todays skilled session focused on balance tasks emphasizing SLS tasks as well as UE/LE dissociation strategies, discouraged use of BUEs and encouraged single UE support, patient continues to remain tense with little alt. arm swing with gait noted    Personal Factors and Comorbidities Comorbidity 1    Comorbidities CVA    Examination-Activity Limitations Locomotion Level;Stairs    Examination-Participation Restrictions Occupation    Stability/Clinical Decision Making Stable/Uncomplicated    Rehab Potential Good    PT Frequency 2x / week    PT Duration 8 weeks    PT Treatment/Interventions ADLs/Self Care Home Management;Aquatic Therapy;Gait training;Stair training;Functional mobility training;Therapeutic activities;Therapeutic exercise;Balance training;Neuromuscular re-education;Patient/family education    PT Next Visit Plan Continue SLS  tasks while minimizing UE support    PT Home Exercise Plan 5x STS 2x/day    Consulted and Agree with Plan of Care Patient           Patient will benefit from skilled therapeutic intervention in order to improve the following deficits and impairments:  Abnormal gait,Difficulty walking,Decreased endurance,Decreased activity  tolerance,Decreased balance,Decreased mobility,Decreased strength  Visit Diagnosis: Unsteadiness on feet  Other abnormalities of gait and mobility  Hemiplegia and hemiparesis following cerebral infarction affecting left non-dominant side [facility])     Problem List Patient Active Problem List   Diagnosis Date Noted  . Stroke (HCC) 04/27/2020  . Elevated blood-pressure reading without diagnosis of hypertension 04/27/2020    Hildred Laser 05/29/2020, 8:10 PM  [facility] [address] [place], [place], [address] Phone: [phone]   Fax:  [phone]  Name: Atha Mcbain MRN: [id number] Date of Birth: 04-Nov-1950

## 2020-05-30 ENCOUNTER — Ambulatory Visit: Payer: Medicare HMO

## 2020-05-30 ENCOUNTER — Other Ambulatory Visit: Payer: Self-pay

## 2020-05-30 DIAGNOSIS — I69354 Hemiplegia and hemiparesis following cerebral infarction affecting left non-dominant side: Secondary | ICD-10-CM | POA: Diagnosis not present

## 2020-05-30 DIAGNOSIS — R2681 Unsteadiness on feet: Secondary | ICD-10-CM

## 2020-05-30 DIAGNOSIS — R2689 Other abnormalities of gait and mobility: Secondary | ICD-10-CM | POA: Diagnosis not present

## 2020-05-30 NOTE — Therapy (Signed)
[facility] [address] [place], [place], [address] Phone: [phone]   Fax:  [phone]  Physical Therapy Treatment  Patient Details  Name: Randy Robertson MRN: [id number] Date of Birth: [birth year] Referring Provider (PT): Otilio Carpen MD   Encounter Date: 05/30/2020   PT End of Session - 05/30/20 1705    Visit Number 5    Number of Visits 16    Date for PT Re-Evaluation 07/03/20    Authorization Type [employer]    Progress Note Due on Visit 10    PT Start Time 1700    PT Stop Time 1745    PT Time Calculation (min) 45 min    Equipment Utilized During Treatment Gait belt    Activity Tolerance Patient tolerated treatment well    Behavior During Therapy [facility] for tasks assessed/performed           History reviewed. No pertinent past medical history.  Past Surgical History:  Procedure Laterality Date  . NO PAST SURGERIES      There were no vitals filed for this visit.   Subjective Assessment - 05/30/20 1704    Subjective Mild LLE soreness following walking yesterday    Patient is accompained by: Interpreter    Patient Stated Goals To walk better                             [facility] Adult PT Treatment/Exercise - 05/30/20 0001      Ambulation/Gait   Ambulation/Gait Yes    Ambulation/Gait Assistance 5: Supervision    Ambulation Distance (Feet) 500 Feet    Assistive device None    Gait Comments decreased arm swing      Lumbar Exercises: Seated   Other Seated Lumbar Exercises core exercises of seated hip tosses, shoulder tosses, chops, Victories, 10 reps each with 1.1# ball followed by latissimus pressdowns with inspiration               Balance Exercises - 05/30/20 0001      Balance Exercises: Standing   Standing Eyes Opened Narrow base of support (BOS);Head turns;Foam/compliant surface;5 reps;30 secs;Limitations    Standing Eyes Opened Limitations no UE support, 30s  static hold, 5 turns/nods    Standing Eyes Closed Narrow base of support (BOS);Head turns;Foam/compliant surface;5 reps;Limitations    Standing Eyes Closed Limitations no UE support, 30s static hold, 5 turns/nods    Stepping Strategy Anterior;Foam/compliant surface;Limitations    Stepping Strategy Limitations step ups onto low profile RB, 10x ea.    Other Standing Exercises standing on airex lift ball OH 5x in ea. tandem position    Other Standing Exercises Comments Tandem standing on airex with 5 torso twists ea. direction, no UE support               PT Short Term Goals - 05/24/20 0932      PT SHORT TERM GOAL #1   Title patient to demo initial HEP back to PT    Baseline 05/23/20 began 5x STS BID    Time 4    Period Weeks    Status On-going    Target Date 06/19/20      PT SHORT TERM GOAL #2   Title Assess BERG and establish appropriate goal    Baseline 05/23/20 BERG score 43, goal is 51    Time 4    Period Weeks    Status Achieved    Target Date 06/19/20  PT SHORT TERM GOAL #3   Title Assess FGA/DGI and set appropriate goal    Baseline FGA patially completed due to time constraints; 05/24/20 FGA score 13, goal is 21    Time 5    Period Weeks    Status Achieved    Target Date 06/19/20      PT SHORT TERM GOAL #4   Title Patient to ambulate 552ft w/o need of AD with SBA across outdoor surfaces    Baseline 169ft in clinic under SBA w/o need of AD    Time 5    Period Weeks    Status New    Target Date 06/19/20      PT SHORT TERM GOAL #5   Title Patient to negotiate full flight of stairs with most appropriate pattern and support    Baseline 4 steps with single rail and step to pattern    Time 5    Period Weeks    Status New    Target Date 06/19/20             PT Long Term Goals - 05/15/20 1036      PT LONG TERM GOAL #1   Title patient to demo final HEP back to PT    Baseline TBD    Time 9    Period Weeks    Status New    Target Date 07/17/20      PT  LONG TERM GOAL #2   Title Patient to increase FOTO score to 65    Baseline Initial FOTO score 54    Time 9    Period Weeks    Status New    Target Date 07/17/20      PT LONG TERM GOAL #3   Title Patient to demo gait velocity of 0.8 m/s w/o AD    Baseline 0.55 m/s w/o AD    Time 9    Period Weeks    Status New    Target Date 07/17/20      PT LONG TERM GOAL #4   Title Patient to ambulate 1066ft across outdoor surfaces w/o AD under S    Baseline 147ft across leel indoor surface w/o AD under SBA    Time 9    Period Weeks    Status New    Target Date 07/17/20      PT LONG TERM GOAL #5   Title Patient to perform TUG w/o AD in 13s    Baseline Initial TUG w/o AD 19.02s    Time 9    Period Weeks    Status New    Target Date 07/17/20      Additional Long Term Goals   Additional Long Term Goals Yes      PT LONG TERM GOAL #6   Title patient to perform 5x STS test w/o UE support in 15s    Baseline 5x STS score w/o UE assist 22.16s    Time 9    Period Weeks    Status New    Target Date 07/17/20                 Plan - 05/30/20 1759    Clinical Impression Statement Todays skilled session focused on core sterngth, UE/LE dissociation strategies, tandem balance tasks, vision removed balance activities and challenges in and effort to encourage trunk rotation and reciprocal arm swing during gait and improve balance and cadence.  Patient continues with slow guarded movement  and apprehension.    Personal Factors  and Comorbidities Comorbidity 1    Comorbidities CVA    Examination-Activity Limitations Locomotion Level;Stairs    Examination-Participation Restrictions Occupation    Stability/Clinical Decision Making Stable/Uncomplicated    Rehab Potential Good    PT Frequency 2x / week    PT Duration 8 weeks    PT Treatment/Interventions ADLs/Self Care Home Management;Aquatic Therapy;Gait training;Stair training;Functional mobility training;Therapeutic activities;Therapeutic  exercise;Balance training;Neuromuscular re-education;Patient/family education    PT Next Visit Plan Continue SLS tasks, tandem balance, stepping strategies in and effort to restore normal cadence and gait pattern    PT Home Exercise Plan 5x STS 2x/day    Consulted and Agree with Plan of Care Patient           Patient will benefit from skilled therapeutic intervention in order to improve the following deficits and impairments:  Abnormal gait,Difficulty walking,Decreased endurance,Decreased activity tolerance,Decreased balance,Decreased mobility,Decreased strength  Visit Diagnosis: Unsteadiness on feet  Other abnormalities of gait and mobility  Hemiplegia and hemiparesis following cerebral infarction affecting left non-dominant side [facility])     Problem List Patient Active Problem List   Diagnosis Date Noted  . Stroke (HCC) 04/27/2020  . Elevated blood-pressure reading without diagnosis of hypertension 04/27/2020    Hildred Laser PT 05/30/2020, 6:05 PM  [facility] [address] [place], [place], [address] Phone: [phone]   Fax:  [phone]  Name: Randy Robertson MRN: [id number] Date of Birth: [birth year]

## 2020-06-05 ENCOUNTER — Ambulatory Visit: Payer: Medicare HMO

## 2020-06-05 ENCOUNTER — Other Ambulatory Visit: Payer: Self-pay

## 2020-06-05 DIAGNOSIS — R2689 Other abnormalities of gait and mobility: Secondary | ICD-10-CM

## 2020-06-05 DIAGNOSIS — R2681 Unsteadiness on feet: Secondary | ICD-10-CM | POA: Diagnosis not present

## 2020-06-05 DIAGNOSIS — I69354 Hemiplegia and hemiparesis following cerebral infarction affecting left non-dominant side: Secondary | ICD-10-CM | POA: Diagnosis not present

## 2020-06-05 NOTE — Therapy (Signed)
[facility] [address] [place], [place], [address] Phone: [phone]   Fax:  [phone]  Physical Therapy Treatment  Patient Details  Name: Randy Robertson MRN: [id number] Date of Birth: Sep 16, 1950 Referring Provider (PT): Otilio Carpen MD   Encounter Date: 06/05/2020   PT End of Session - 06/05/20 1617    Visit Number 6    Number of Visits 16    Date for PT Re-Evaluation 07/03/20    Authorization Type [employer]    Progress Note Due on Visit 10    PT Start Time 1615    PT Stop Time 1700    PT Time Calculation (min) 45 min    Equipment Utilized During Treatment Gait belt    Activity Tolerance Patient tolerated treatment well    Behavior During Therapy [facility] for tasks assessed/performed           History reviewed. No pertinent past medical history.  Past Surgical History:  Procedure Laterality Date  . NO PAST SURGERIES      There were no vitals filed for this visit.   Subjective Assessment - 06/05/20 1621    Subjective feels he has to move slowly due to apprehension and fatigue    Patient is accompained by: Interpreter    Pertinent History Randy Robertson is a 70 y.o. male with no significant past medical history who presents 4/21 from [facility]  ER for stroke work-up due to left arm weakness and unsteady gait.  [id number] acute ischemia within the right MCA territory.    Patient Stated Goals To walk better                             [facility] Adult PT Treatment/Exercise - 06/05/20 0001      Transfers   Transfers Sit to Stand    Comments performed 10 reps holding ball and raising it OH 10x      Ambulation/Gait   Ambulation/Gait Yes    Ambulation/Gait Assistance 5: Supervision    Ambulation Distance (Feet) 115 Feet    Assistive device None    Gait Comments bounced ball while amblating to generate random reaching needs   ball while amblating to generate random reaching needs      Lumbar Exercises: Seated   Other Seated Lumbar Exercises core exercises of seated hip tosses, shoulder tosses, chops, Victories, 10 reps each with 2.2# ball followed by latissimus pressdowns with inspiration x10    Other Seated Lumbar Exercises seated OH ball lift with breathing patterns, torso twiss with ball 10x      Knee/Hip Exercises: Aerobic   Nustep L2 arms 10 8' 40 step/min target pace               Balance Exercises - 06/05/20 0001      Balance Exercises: Standing   Standing Eyes Opened Narrow base of support (BOS);Head turns;Foam/compliant surface;5 reps    Standing Eyes Opened Limitations standing on pillow 30s hold, 5 head turns/nods    Standing Eyes Closed Narrow base of support (BOS);Foam/compliant surface;5 reps;30 secs;Limitations    Standing Eyes Closed Limitations 30s hold, head turns/nods5               PT Short Term Goals - 05/24/20 0932      PT SHORT TERM GOAL #1   Title patient to demo initial HEP back to PT    Baseline 05/23/20 began 5x STS BID    Time 4  Period Weeks    Status On-going    Target Date 06/19/20      PT SHORT TERM GOAL #2   Title Assess BERG and establish appropriate goal    Baseline 05/23/20 BERG score 43, goal is 51    Time 4    Period Weeks    Status Achieved    Target Date 06/19/20      PT SHORT TERM GOAL #3   Title Assess FGA/DGI and set appropriate goal    Baseline FGA patially completed due to time constraints; 05/24/20 FGA score 13, goal is 21    Time 5    Period Weeks    Status Achieved    Target Date 06/19/20      PT SHORT TERM GOAL #4   Title Patient to ambulate 528ft w/o need of AD with SBA across outdoor surfaces    Baseline 173ft in clinic under SBA w/o need of AD    Time 5    Period Weeks    Status New    Target Date 06/19/20      PT SHORT TERM GOAL #5   Title Patient to negotiate full flight of stairs with most appropriate pattern and support    Baseline 4 steps with single rail and step to pattern     Time 5    Period Weeks    Status New    Target Date 06/19/20             PT Long Term Goals - 05/15/20 1036      PT LONG TERM GOAL #1   Title patient to demo final HEP back to PT    Baseline TBD    Time 9    Period Weeks    Status New    Target Date 07/17/20      PT LONG TERM GOAL #2   Title Patient to increase FOTO score to 65    Baseline Initial FOTO score 54    Time 9    Period Weeks    Status New    Target Date 07/17/20      PT LONG TERM GOAL #3   Title Patient to demo gait velocity of 0.8 m/s w/o AD    Baseline 0.55 m/s w/o AD    Time 9    Period Weeks    Status New    Target Date 07/17/20      PT LONG TERM GOAL #4   Title Patient to ambulate 106ft across outdoor surfaces w/o AD under S    Baseline 180ft across leel indoor surface w/o AD under SBA    Time 9    Period Weeks    Status New    Target Date 07/17/20      PT LONG TERM GOAL #5   Title Patient to perform TUG w/o AD in 13s    Baseline Initial TUG w/o AD 19.02s    Time 9    Period Weeks    Status New    Target Date 07/17/20      Additional Long Term Goals   Additional Long Term Goals Yes      PT LONG TERM GOAL #6   Title patient to perform 5x STS test w/o UE support in 15s    Baseline 5x STS score w/o UE assist 22.16s    Time 9    Period Weeks    Status New    Target Date 07/17/20  Plan - 06/05/20 1617    Clinical Impression Statement Continued with core execises for sterngth, stability and extremity dissociation encouraged head movements in unison with chop patterns.  Nustep for 8' with goal of maintaining 40steps/min.  Showing increased cadence today during ambulation as well as less rigidity.  His main limitations are self limiting behavuor due to apprehension and tense posture    Personal Factors and Comorbidities Comorbidity 1    Comorbidities CVA    Examination-Activity Limitations Locomotion Level;Stairs    Examination-Participation Restrictions  Occupation    Stability/Clinical Decision Making Stable/Uncomplicated    Rehab Potential Good    PT Frequency 2x / week    PT Duration 8 weeks    PT Treatment/Interventions ADLs/Self Care Home Management;Aquatic Therapy;Gait training;Stair training;Functional mobility training;Therapeutic activities;Therapeutic exercise;Balance training;Neuromuscular re-education;Patient/family education    PT Next Visit Plan continue gait and balance training with activities that promote UE/LE dissociation and trunk mobility    PT Home Exercise Plan 5x STS 2x/day    Consulted and Agree with Plan of Care Patient           Patient will benefit from skilled therapeutic intervention in order to improve the following deficits and impairments:  Abnormal gait,Difficulty walking,Decreased endurance,Decreased activity tolerance,Decreased balance,Decreased mobility,Decreased strength  Visit Diagnosis: Unsteadiness on feet  Other abnormalities of gait and mobility  Hemiplegia and hemiparesis following cerebral infarction affecting left non-dominant side [facility])     Problem List Patient Active Problem List   Diagnosis Date Noted  . Stroke (HCC) 04/27/2020  . Elevated blood-pressure reading without diagnosis of hypertension 04/27/2020    Hildred Laser  PT 06/05/2020, 4:57 PM  [facility] [address] [place], [place], [address] Phone: [phone]   Fax:  [phone]  Name: Randy Robertson MRN: [id number] Date of Birth: Nov 22, 1950

## 2020-06-07 ENCOUNTER — Ambulatory Visit: Payer: Medicare HMO | Attending: Internal Medicine

## 2020-06-07 ENCOUNTER — Other Ambulatory Visit: Payer: Self-pay

## 2020-06-07 DIAGNOSIS — R2681 Unsteadiness on feet: Secondary | ICD-10-CM | POA: Diagnosis not present

## 2020-06-07 DIAGNOSIS — R2689 Other abnormalities of gait and mobility: Secondary | ICD-10-CM | POA: Diagnosis not present

## 2020-06-07 DIAGNOSIS — I69354 Hemiplegia and hemiparesis following cerebral infarction affecting left non-dominant side: Secondary | ICD-10-CM

## 2020-06-07 NOTE — Therapy (Signed)
[facility] [address] [place], [place], [address] Phone: [phone]   Fax:  [phone]  Physical Therapy Treatment  Patient Details  Name: Randy Robertson MRN: [id number] Date of Birth: 08-Sep-1950 Referring Provider (PT): Otilio Carpen MD   Encounter Date: 06/07/2020   PT End of Session - 06/07/20 1054    Visit Number 7    Number of Visits 16    Date for PT Re-Evaluation 07/03/20    Authorization Type [employer]    Progress Note Due on Visit 10    PT Start Time 1015    PT Stop Time 1100    PT Time Calculation (min) 45 min    Equipment Utilized During Treatment Gait belt    Activity Tolerance Patient tolerated treatment well    Behavior During Therapy [facility] for tasks assessed/performed           History reviewed. No pertinent past medical history.  Past Surgical History:  Procedure Laterality Date  . NO PAST SURGERIES      There were no vitals filed for this visit.   Subjective Assessment - 06/07/20 1017    Subjective Continues to feel more confident in walking and has bee prtforming some exercises at home simulating PT activities.    Patient is accompained by: Interpreter    Pertinent History Randy Robertson is a 70 y.o. male with no significant past medical history who presents 4/21 from [facility]  ER for stroke work-up due to left arm weakness and unsteady gait.  [id number] acute ischemia within the right MCA territory.    Patient Stated Goals To walk better                             [facility] Adult PT Treatment/Exercise - 06/07/20 0001      Transfers   Transfers Sit to Stand    Sit to Stand 5: Supervision    Comments performed 10 reps holding ball and performing chest press with 3.3#      Ambulation/Gait   Ambulation/Gait Yes    Ambulation/Gait Assistance 5: Supervision;4: Min guard    Ambulation Distance (Feet) 1000 Feet    Assistive device None    Gait Pattern  Step-through pattern    Ambulation Surface Level;Unlevel;Indoor;Outdoor;Grass    Gait Comments added head turns/nods, weaving and fig 8s, OH reaching from grassy surface      Lumbar Exercises: Seated   Other Seated Lumbar Exercises core exercises of seated hip tosses, shoulder tosses, chops, Victories, 10 reps each with 3.3# ball followed by latissimus pressdowns with inspiration x10               Balance Exercises - 06/07/20 0001      Balance Exercises: Standing   Tandem Gait Forward;Upper extremity support;Limitations;5 reps    Tandem Gait Limitations performed at counter    Retro Gait Upper extremity support;5 reps;Limitations    Retro Gait Limitations performed at counter    Sidestepping Upper extremity support;5 reps;Limitations    Sidestepping Limitations performed at counter               PT Short Term Goals - 05/24/20 0932      PT SHORT TERM GOAL #1   Title patient to demo initial HEP back to PT    Baseline 05/23/20 began 5x STS BID    Time 4    Period Weeks    Status On-going    Target Date  06/19/20      PT SHORT TERM GOAL #2   Title Assess BERG and establish appropriate goal    Baseline 05/23/20 BERG score 43, goal is 51    Time 4    Period Weeks    Status Achieved    Target Date 06/19/20      PT SHORT TERM GOAL #3   Title Assess FGA/DGI and set appropriate goal    Baseline FGA patially completed due to time constraints; 05/24/20 FGA score 13, goal is 21    Time 5    Period Weeks    Status Achieved    Target Date 06/19/20      PT SHORT TERM GOAL #4   Title Patient to ambulate 553ft w/o need of AD with SBA across outdoor surfaces    Baseline 131ft in clinic under SBA w/o need of AD    Time 5    Period Weeks    Status New    Target Date 06/19/20      PT SHORT TERM GOAL #5   Title Patient to negotiate full flight of stairs with most appropriate pattern and support    Baseline 4 steps with single rail and step to pattern    Time 5    Period  Weeks    Status New    Target Date 06/19/20             PT Long Term Goals - 05/15/20 1036      PT LONG TERM GOAL #1   Title patient to demo final HEP back to PT    Baseline TBD    Time 9    Period Weeks    Status New    Target Date 07/17/20      PT LONG TERM GOAL #2   Title Patient to increase FOTO score to 65    Baseline Initial FOTO score 54    Time 9    Period Weeks    Status New    Target Date 07/17/20      PT LONG TERM GOAL #3   Title Patient to demo gait velocity of 0.8 m/s w/o AD    Baseline 0.55 m/s w/o AD    Time 9    Period Weeks    Status New    Target Date 07/17/20      PT LONG TERM GOAL #4   Title Patient to ambulate 101ft across outdoor surfaces w/o AD under S    Baseline 171ft across leel indoor surface w/o AD under SBA    Time 9    Period Weeks    Status New    Target Date 07/17/20      PT LONG TERM GOAL #5   Title Patient to perform TUG w/o AD in 13s    Baseline Initial TUG w/o AD 19.02s    Time 9    Period Weeks    Status New    Target Date 07/17/20      Additional Long Term Goals   Additional Long Term Goals Yes      PT LONG TERM GOAL #6   Title patient to perform 5x STS test w/o UE support in 15s    Baseline 5x STS score w/o UE assist 22.16s    Time 9    Period Weeks    Status New    Target Date 07/17/20                 Plan - 06/07/20 1054  Clinical Impression Statement Todays session focused on continued core strengthening and gait training, adding balance challenges of head turns/nods as well as fig 8s, weaving and OH reaching to signposts aon grassy surfaces.  Mild LOB noted with head mvmts.  Fatigued with increased weith on ball, HEP established ofr gait and balance    Personal Factors and Comorbidities Comorbidity 1    Comorbidities CVA    Examination-Activity Limitations Locomotion Level;Stairs    Examination-Participation Restrictions Occupation    Stability/Clinical Decision Making Stable/Uncomplicated     Rehab Potential Good    PT Frequency 2x / week    PT Duration 8 weeks    PT Treatment/Interventions ADLs/Self Care Home Management;Aquatic Therapy;Gait training;Stair training;Functional mobility training;Therapeutic activities;Therapeutic exercise;Balance training;Neuromuscular re-education;Patient/family education    PT Next Visit Plan f/u on HEP performance, increased reps/resistance as tolerated    PT Home Exercise Plan [id number]    Consulted and Agree with Plan of Care Patient           Patient will benefit from skilled therapeutic intervention in order to improve the following deficits and impairments:  Abnormal gait,Difficulty walking,Decreased endurance,Decreased activity tolerance,Decreased balance,Decreased mobility,Decreased strength  Visit Diagnosis: Unsteadiness on feet  Other abnormalities of gait and mobility  Hemiplegia and hemiparesis following cerebral infarction affecting left non-dominant side [facility])     Problem List Patient Active Problem List   Diagnosis Date Noted  . Stroke (HCC) 04/27/2020  . Elevated blood-pressure reading without diagnosis of hypertension 04/27/2020    Hildred Laser PT 06/07/2020, 11:01 AM  [facility] [address] [place], [place], [address] Phone: [phone]   Fax:  [phone]  Name: Randy Robertson MRN: [id number] Date of Birth: 1950-04-18

## 2020-06-12 ENCOUNTER — Other Ambulatory Visit: Payer: Self-pay

## 2020-06-12 ENCOUNTER — Ambulatory Visit: Payer: Medicare HMO

## 2020-06-12 DIAGNOSIS — I69354 Hemiplegia and hemiparesis following cerebral infarction affecting left non-dominant side: Secondary | ICD-10-CM | POA: Diagnosis not present

## 2020-06-12 DIAGNOSIS — R2681 Unsteadiness on feet: Secondary | ICD-10-CM | POA: Diagnosis not present

## 2020-06-12 DIAGNOSIS — R2689 Other abnormalities of gait and mobility: Secondary | ICD-10-CM | POA: Diagnosis not present

## 2020-06-12 NOTE — Therapy (Signed)
[facility] [address] [place], [place], [address] Phone: [phone]   Fax:  [phone]  Physical Therapy Treatment  Patient Details  Name: Randy Robertson MRN: [id number] Date of Birth: 03/30/1950 Referring Provider (PT): Otilio Carpen MD   Encounter Date: 06/12/2020   PT End of Session - 06/12/20 1110    Visit Number 8    Number of Visits 16    Date for PT Re-Evaluation 07/03/20    Authorization Type [employer]    Progress Note Due on Visit 10    PT Start Time 1015    PT Stop Time 1100    PT Time Calculation (min) 45 min    Equipment Utilized During Treatment Gait belt    Activity Tolerance Patient tolerated treatment well    Behavior During Therapy [facility] for tasks assessed/performed           History reviewed. No pertinent past medical history.  Past Surgical History:  Procedure Laterality Date  . NO PAST SURGERIES      There were no vitals filed for this visit.   Subjective Assessment - 06/12/20 1019    Subjective Reports no falls or med change, feels better overall, still loses balance at times in posterolateral direction    Patient is accompained by: Interpreter    Pertinent History Randy Robertson is a 70 y.o. male with no significant past medical history who presents 4/21 from [facility]  ER for stroke work-up due to left arm weakness and unsteady gait.  LKT:[id number] acute ischemia within the right MCA territory.    Patient Stated Goals To walk better                             [facility] Adult PT Treatment/Exercise - 06/12/20 0001      Transfers   Transfers Sit to Stand    Sit to Stand 5: Supervision    Number of Reps 10 reps    Comments performed on Airex with 2.2# OH lift with ball      Ambulation/Gait   Ambulation/Gait Yes    Ambulation/Gait Assistance 5: Supervision;4: Min guard    Ambulation Distance (Feet) 250 Feet    Assistive device None    Gait Pattern  Step-through pattern    Ambulation Surface Level;Indoor    Gait Comments added self toss/catch with ball      Lumbar Exercises: Standing   Other Standing Lumbar Exercises core exercises of standing hip tosses, shoulder tosses, chops, Victories, 10 reps each with 2.2# ball followed by latissimus pressdowns with contralateral hip/shoulder abduction      Lumbar Exercises: Seated   Other Seated Lumbar Exercises --      Knee/Hip Exercises: Aerobic   Nustep L2 arms 10 8' with 50-70 steps per min               Balance Exercises - 06/12/20 0001      Balance Exercises: Standing   Other Standing Exercises tandem stance with cross body reaching at various heights alternating UEs               PT Short Term Goals - 05/24/20 0932      PT SHORT TERM GOAL #1   Title patient to demo initial HEP back to PT    Baseline 05/23/20 began 5x STS BID    Time 4    Period Weeks    Status On-going    Target Date 06/19/20  PT SHORT TERM GOAL #2   Title Assess BERG and establish appropriate goal    Baseline 05/23/20 BERG score 43, goal is 51    Time 4    Period Weeks    Status Achieved    Target Date 06/19/20      PT SHORT TERM GOAL #3   Title Assess FGA/DGI and set appropriate goal    Baseline FGA patially completed due to time constraints; 05/24/20 FGA score 13, goal is 21    Time 5    Period Weeks    Status Achieved    Target Date 06/19/20      PT SHORT TERM GOAL #4   Title Patient to ambulate 552ft w/o need of AD with SBA across outdoor surfaces    Baseline 174ft in clinic under SBA w/o need of AD    Time 5    Period Weeks    Status New    Target Date 06/19/20      PT SHORT TERM GOAL #5   Title Patient to negotiate full flight of stairs with most appropriate pattern and support    Baseline 4 steps with single rail and step to pattern    Time 5    Period Weeks    Status New    Target Date 06/19/20             PT Long Term Goals - 05/15/20 1036      PT LONG TERM  GOAL #1   Title patient to demo final HEP back to PT    Baseline TBD    Time 9    Period Weeks    Status New    Target Date 07/17/20      PT LONG TERM GOAL #2   Title Patient to increase FOTO score to 65    Baseline Initial FOTO score 54    Time 9    Period Weeks    Status New    Target Date 07/17/20      PT LONG TERM GOAL #3   Title Patient to demo gait velocity of 0.8 m/s w/o AD    Baseline 0.55 m/s w/o AD    Time 9    Period Weeks    Status New    Target Date 07/17/20      PT LONG TERM GOAL #4   Title Patient to ambulate 1055ft across outdoor surfaces w/o AD under S    Baseline 123ft across leel indoor surface w/o AD under SBA    Time 9    Period Weeks    Status New    Target Date 07/17/20      PT LONG TERM GOAL #5   Title Patient to perform TUG w/o AD in 13s    Baseline Initial TUG w/o AD 19.02s    Time 9    Period Weeks    Status New    Target Date 07/17/20      Additional Long Term Goals   Additional Long Term Goals Yes      PT LONG TERM GOAL #6   Title patient to perform 5x STS test w/o UE support in 15s    Baseline 5x STS score w/o UE assist 22.16s    Time 9    Period Weeks    Status New    Target Date 07/17/20                 Plan - 06/12/20 1030    Clinical Impression Statement Todays  session consisted of aerobic activity and advanced to core execises in standing to challege balance along with core strengthening, instructe in contralateral shoulder /hip abduction tasks to challenge lateral balance.  Incorporated reahing tasks in tandem stance to promote cross body tasks and reaching    Personal Factors and Comorbidities Comorbidity 1    Comorbidities CVA    Examination-Activity Limitations Locomotion Level;Stairs    Examination-Participation Restrictions Occupation    Stability/Clinical Decision Making Stable/Uncomplicated    Rehab Potential Good    PT Frequency 2x / week    PT Duration 8 weeks    PT Treatment/Interventions ADLs/Self  Care Home Management;Aquatic Therapy;Gait training;Stair training;Functional mobility training;Therapeutic activities;Therapeutic exercise;Balance training;Neuromuscular re-education;Patient/family education    PT Next Visit Plan continue standing tasks in tandem and promote cross body reaching and balance training as well as U/LE dissocition with rotation    PT Home Exercise Plan [id number]    Consulted and Agree with Plan of Care Patient           Patient will benefit from skilled therapeutic intervention in order to improve the following deficits and impairments:  Abnormal gait,Difficulty walking,Decreased endurance,Decreased activity tolerance,Decreased balance,Decreased mobility,Decreased strength  Visit Diagnosis: Unsteadiness on feet  Other abnormalities of gait and mobility  Hemiplegia and hemiparesis following cerebral infarction affecting left non-dominant side [facility])     Problem List Patient Active Problem List   Diagnosis Date Noted  . Stroke (HCC) 04/27/2020  . Elevated blood-pressure reading without diagnosis of hypertension 04/27/2020    Hildred Laser 06/12/2020, 11:12 AM  [facility] [address] [place], [place], [address] Phone: [phone]   Fax:  [phone]  Name: Randy Robertson MRN: [id number] Date of Birth: [birth year]

## 2020-06-14 ENCOUNTER — Other Ambulatory Visit: Payer: Self-pay

## 2020-06-14 ENCOUNTER — Ambulatory Visit: Payer: Medicare HMO

## 2020-06-14 DIAGNOSIS — R2681 Unsteadiness on feet: Secondary | ICD-10-CM

## 2020-06-14 DIAGNOSIS — I69354 Hemiplegia and hemiparesis following cerebral infarction affecting left non-dominant side: Secondary | ICD-10-CM | POA: Diagnosis not present

## 2020-06-14 DIAGNOSIS — R2689 Other abnormalities of gait and mobility: Secondary | ICD-10-CM | POA: Diagnosis not present

## 2020-06-14 NOTE — Therapy (Signed)
[facility] [address] [place], [place], [address] Phone: [phone]   Fax:  [phone]  Physical Therapy Treatment  Patient Details  Name: Randy Robertson MRN: [id number] Date of Birth: 22-Feb-1950 Referring Provider (PT): Otilio Carpen MD   Encounter Date: 06/14/2020   PT End of Session - 06/14/20 1032     Visit Number 9    Number of Visits 16    Date for PT Re-Evaluation 07/03/20    Authorization Type [employer]    Progress Note Due on Visit 10    PT Start Time 1015    PT Stop Time 1100    PT Time Calculation (min) 45 min    Equipment Utilized During Treatment Gait belt    Activity Tolerance Patient tolerated treatment well    Behavior During Therapy [facility] for tasks assessed/performed             History reviewed. No pertinent past medical history.  Past Surgical History:  Procedure Laterality Date   NO PAST SURGERIES      There were no vitals filed for this visit.   Subjective Assessment - 06/14/20 1101     Subjective Reports no falls or med change, feels better overall, still loses balance at times but confidence improved.    Patient is accompained by: Interpreter    Pertinent History Randy Robertson is a 70 y.o. male with no significant past medical history who presents 4/21 from [facility]  ER for stroke work-up due to left arm weakness and unsteady gait.  YKD:[id number] acute ischemia within the right MCA territory.    Patient Stated Goals To walk better                               [facility] Adult PT Treatment/Exercise - 06/14/20 0001       Transfers   Transfers Sit to Stand    Sit to Stand 5: Supervision;4: Min guard    Number of Reps 10 reps    Comments performed on Airex with 3.3# ball chest press, 10x      Ambulation/Gait   Ambulation/Gait Yes    Ambulation/Gait Assistance 5: Supervision;4: Min guard    Ambulation Distance (Feet) 230 Feet    Assistive device  None    Gait Pattern Step-through pattern    Ambulation Surface Level;Indoor    Gait Comments placed ball from one hand to the other      Lumbar Exercises: Standing   Other Standing Lumbar Exercises core exercises of standing hip tosses, shoulder tosses, chops, Victories, 10 reps each with 3.3# ball followed by latissimus pressdowns in sitting and  contralateral hip/shoulder abduction in standing for 10 reps per task      Knee/Hip Exercises: Aerobic   Other Aerobic Scifit L2 8' arms 8                 Balance Exercises - 06/14/20 0001       Balance Exercises: Standing   Tandem Stance Eyes open;5 reps;Limitations    Tandem Stance Time partial tandem stance, EO, head turns/nods 5x ea. in ea. position    Other Standing Exercises Comments Standing with cross body reaching in PNF patterns                 PT Short Term Goals - 05/24/20 0932       PT SHORT TERM GOAL #1   Title patient to demo initial HEP  back to PT    Baseline 05/23/20 began 5x STS BID    Time 4    Period Weeks    Status On-going    Target Date 06/19/20      PT SHORT TERM GOAL #2   Title Assess BERG and establish appropriate goal    Baseline 05/23/20 BERG score 43, goal is 51    Time 4    Period Weeks    Status Achieved    Target Date 06/19/20      PT SHORT TERM GOAL #3   Title Assess FGA/DGI and set appropriate goal    Baseline FGA patially completed due to time constraints; 05/24/20 FGA score 13, goal is 21    Time 5    Period Weeks    Status Achieved    Target Date 06/19/20      PT SHORT TERM GOAL #4   Title Patient to ambulate 549ft w/o need of AD with SBA across outdoor surfaces    Baseline 185ft in clinic under SBA w/o need of AD    Time 5    Period Weeks    Status New    Target Date 06/19/20      PT SHORT TERM GOAL #5   Title Patient to negotiate full flight of stairs with most appropriate pattern and support    Baseline 4 steps with single rail and step to pattern    Time 5     Period Weeks    Status New    Target Date 06/19/20               PT Long Term Goals - 05/15/20 1036       PT LONG TERM GOAL #1   Title patient to demo final HEP back to PT    Baseline TBD    Time 9    Period Weeks    Status New    Target Date 07/17/20      PT LONG TERM GOAL #2   Title Patient to increase FOTO score to 65    Baseline Initial FOTO score 54    Time 9    Period Weeks    Status New    Target Date 07/17/20      PT LONG TERM GOAL #3   Title Patient to demo gait velocity of 0.8 m/s w/o AD    Baseline 0.55 m/s w/o AD    Time 9    Period Weeks    Status New    Target Date 07/17/20      PT LONG TERM GOAL #4   Title Patient to ambulate 1050ft across outdoor surfaces w/o AD under S    Baseline 163ft across leel indoor surface w/o AD under SBA    Time 9    Period Weeks    Status New    Target Date 07/17/20      PT LONG TERM GOAL #5   Title Patient to perform TUG w/o AD in 13s    Baseline Initial TUG w/o AD 19.02s    Time 9    Period Weeks    Status New    Target Date 07/17/20      Additional Long Term Goals   Additional Long Term Goals Yes      PT LONG TERM GOAL #6   Title patient to perform 5x STS test w/o UE support in 15s    Baseline 5x STS score w/o UE assist 22.16s    Time 9  Period Weeks    Status New    Target Date 07/17/20                   Plan - 06/14/20 1033     Clinical Impression Statement Continued core strengthening including PNF patterning for core strengthening and UE/LE dissociation techniques, increased resistance on activities.  Continued to challenge balance in various positions    Personal Factors and Comorbidities Comorbidity 1    Comorbidities CVA    Examination-Activity Limitations Locomotion Level;Stairs    Examination-Participation Restrictions Occupation    Stability/Clinical Decision Making Stable/Uncomplicated    Rehab Potential Good    PT Frequency 2x / week    PT Duration 8 weeks    PT  Treatment/Interventions ADLs/Self Care Home Management;Aquatic Therapy;Gait training;Stair training;Functional mobility training;Therapeutic activities;Therapeutic exercise;Balance training;Neuromuscular re-education;Patient/family education    PT Next Visit Plan continue standing tasks in tandem and promote cross body reaching and balance training as well as U/LE dissocition with rotation, increase resistance and difficulty of tasks, 10th visit progress note    PT Home Exercise Plan [id number]    Consulted and Agree with Plan of Care Patient             Patient will benefit from skilled therapeutic intervention in order to improve the following deficits and impairments:  Abnormal gait, Difficulty walking, Decreased endurance, Decreased activity tolerance, Decreased balance, Decreased mobility, Decreased strength  Visit Diagnosis: Unsteadiness on feet  Other abnormalities of gait and mobility  Hemiplegia and hemiparesis following cerebral infarction affecting left non-dominant side [facility])     Problem List Patient Active Problem List   Diagnosis Date Noted   Stroke (HCC) 04/27/2020   Elevated blood-pressure reading without diagnosis of hypertension 04/27/2020    Hildred Laser PT 06/14/2020, 11:25 AM  [facility] [address] [place], [place], [address] Phone: [phone]   Fax:  [phone]  Name: Randy Robertson MRN: [id number] Date of Birth: 1950/10/02

## 2020-06-19 ENCOUNTER — Other Ambulatory Visit: Payer: Self-pay

## 2020-06-19 ENCOUNTER — Ambulatory Visit: Payer: Medicare HMO

## 2020-06-19 DIAGNOSIS — R2681 Unsteadiness on feet: Secondary | ICD-10-CM

## 2020-06-19 DIAGNOSIS — I69354 Hemiplegia and hemiparesis following cerebral infarction affecting left non-dominant side: Secondary | ICD-10-CM

## 2020-06-19 DIAGNOSIS — R2689 Other abnormalities of gait and mobility: Secondary | ICD-10-CM | POA: Diagnosis not present

## 2020-06-19 NOTE — Therapy (Addendum)
[facility] [address] [place], [place], [address] Phone: [phone]   Fax:  [phone]  Physical Therapy Treatment/Progress Note  Patient Details  Name: Randy Robertson MRN: [id number] Date of Birth: Mar 29, 1950 Referring Provider (PT): Otilio Carpen MD   Encounter Date: 06/19/2020 Physical Therapy Progress Note   Dates of Reporting Period:05/14/20-06/19/20  See Note below for Objective Data and Assessment of Progress/Goals.    PT End of Session - 06/19/20 1021     Visit Number 10    Number of Visits 16    Date for PT Re-Evaluation 07/03/20    Authorization Type [employer]    Progress Note Due on Visit 10    PT Start Time 1015    PT Stop Time 1100    PT Time Calculation (min) 45 min    Equipment Utilized During Treatment Gait belt    Activity Tolerance Patient tolerated treatment well    Behavior During Therapy WFL for tasks assessed/performed             History reviewed. No pertinent past medical history.  Past Surgical History:  Procedure Laterality Date   NO PAST SURGERIES      There were no vitals filed for this visit.                      [facility] Adult PT Treatment/Exercise - 06/19/20 0001       Transfers   Transfers Sit to Stand    Sit to Stand 5: Supervision;4: Min guard    Five time sit to stand comments  21.1      Ambulation/Gait   Ambulation/Gait Yes    Ambulation Distance (Feet) 500 Feet    Assistive device None    Gait Pattern Step-through pattern    Ambulation Surface Level;Unlevel;Indoor;Outdoor;Paved    Gait velocity 0.59 m/s    Stairs Yes    Stairs Assistance 5: Supervision    Stair Management Technique Two rails;Alternating pattern    Number of Stairs 16      Knee/Hip Exercises: Aerobic   Other Aerobic Scifit L1 8' arms 8                      PT Short Term Goals - 06/19/20 1028       PT SHORT TERM GOAL #1   Title patient to  demo initial HEP back to PT    Baseline 05/23/20 began 5x STS BID; 06/19/20 Patient able to demo HEP back w/o need of cuing    Time 4    Period Weeks    Status Achieved    Target Date 06/19/20      PT SHORT TERM GOAL #2   Title Assess BERG and establish appropriate goal    Baseline 05/23/20 BERG score 43, goal is 51    Time 4    Period Weeks    Status Achieved    Target Date 06/19/20      PT SHORT TERM GOAL #3   Title Assess FGA/DGI and set appropriate goal    Baseline FGA patially completed due to time constraints; 05/24/20 FGA score 13, goal is 21    Time 5    Period Weeks    Status Achieved    Target Date 06/19/20      PT SHORT TERM GOAL #4   Title Patient to ambulate 510ft w/o need of AD with SBA across outdoor surfaces    Baseline 17ft in clinic  under SBA w/o need of AD; 06/19/20 Ambulation of 589ft outside w/o ned of AD under S    Time 5    Period Weeks    Status Achieved    Target Date 06/19/20      PT SHORT TERM GOAL #5   Title Patient to negotiate full flight of stairs with most appropriate pattern and support    Baseline 4 steps with single rail and step to pattern; 06/19/20 Able to negotiate 16steps with B rails and step through pattern under S    Time 5    Period Weeks    Status Achieved    Target Date 06/19/20               PT Long Term Goals - 06/19/20 1046       PT LONG TERM GOAL #1   Title patient to demo final HEP back to PT    Baseline TBD    Time 9    Period Weeks    Status New      PT LONG TERM GOAL #2   Title Patient to increase FOTO score to 65    Baseline Initial FOTO score 54    Time 9    Period Weeks    Status New      PT LONG TERM GOAL #3   Title Patient to demo gait velocity of 0.8 m/s w/o AD    Baseline 0.55 m/s w/o AD; 06/19/20 gait velocity 0.59 m/s    Time 9    Period Weeks    Status New      PT LONG TERM GOAL #4   Title Patient to ambulate 1053ft across outdoor surfaces w/o AD under S    Baseline 143ft across leel  indoor surface w/o AD under SBA    Time 9    Period Weeks    Status New      PT LONG TERM GOAL #5   Title Patient to perform TUG w/o AD in 13s; 06/19/20 TUG w/o AD 15.4s    Baseline Initial TUG w/o AD 19.02s    Time 9    Period Weeks    Status On-going      PT LONG TERM GOAL #6   Title patient to perform 5x STS test w/o UE support in 15s    Baseline 5x STS score w/o UE assist 22.16s; 06/19/20 5x STS with UE support 21.1s    Time 9    Period Weeks    Status New                   Plan - 06/19/20 1059     Clinical Impression Statement Todays session consisted of progress assessment towards goals, patient showing slight improvemen in overall performance but self limits performance based on observed capacity of tasks performed in clinic.  Patient instructed to trust his capability and attempt to increase cadence at home    Personal Factors and Comorbidities Comorbidity 1    Comorbidities CVA    Examination-Activity Limitations Locomotion Level;Stairs    Examination-Participation Restrictions Occupation    Stability/Clinical Decision Making Stable/Uncomplicated    Rehab Potential Good    PT Frequency 2x / week    PT Duration 8 weeks    PT Treatment/Interventions ADLs/Self Care Home Management;Aquatic Therapy;Gait training;Stair training;Functional mobility training;Therapeutic activities;Therapeutic exercise;Balance training;Neuromuscular re-education;Patient/family education    PT Next Visit Plan continue standing tasks in tandem and promote cross body reaching and balance training as well as U/LE dissocition with  rotation, increase resistance and difficulty of tasks, encourage increased cadence with activity as well as try to relax tension    PT Home Exercise Plan [id number]    Consulted and Agree with Plan of Care Patient             Patient will benefit from skilled therapeutic intervention in order to improve the following deficits and impairments:  Abnormal gait,  Difficulty walking, Decreased endurance, Decreased activity tolerance, Decreased balance, Decreased mobility, Decreased strength  Visit Diagnosis: Unsteadiness on feet  Hemiplegia and hemiparesis following cerebral infarction affecting left non-dominant side (HCC)  Other abnormalities of gait and mobility     Problem List Patient Active Problem List   Diagnosis Date Noted   Stroke (HCC) 04/27/2020   Elevated blood-pressure reading without diagnosis of hypertension 04/27/2020    Hildred Laser PT 06/19/2020, 11:14 AM  [facility] [address] [place], [place], [address] Phone: [phone]   Fax:  [phone]  Name: Hassell Patras MRN: [id number] Date of Birth: [birth year]

## 2020-06-21 ENCOUNTER — Other Ambulatory Visit: Payer: Self-pay

## 2020-06-21 ENCOUNTER — Ambulatory Visit: Payer: Medicare HMO

## 2020-06-21 DIAGNOSIS — I69354 Hemiplegia and hemiparesis following cerebral infarction affecting left non-dominant side: Secondary | ICD-10-CM | POA: Diagnosis not present

## 2020-06-21 DIAGNOSIS — R2689 Other abnormalities of gait and mobility: Secondary | ICD-10-CM

## 2020-06-21 DIAGNOSIS — R2681 Unsteadiness on feet: Secondary | ICD-10-CM | POA: Diagnosis not present

## 2020-06-21 NOTE — Therapy (Signed)
[facility] [address] [place], [place], [address] Phone: [phone]   Fax:  [phone]  Physical Therapy Treatment  Patient Details  Name: Randy Robertson MRN: [id number] Date of Birth: [birth year] Referring Provider (PT): Otilio Carpen MD   Encounter Date: 06/21/2020   PT End of Session - 06/21/20 1104     Visit Number 11    Number of Visits 16    Date for PT Re-Evaluation 07/03/20    Authorization Type [employer]    Progress Note Due on Visit 10    PT Start Time 1015    PT Stop Time 1100    PT Time Calculation (min) 45 min    Equipment Utilized During Treatment Gait belt    Activity Tolerance Patient tolerated treatment well    Behavior During Therapy [facility] for tasks assessed/performed             History reviewed. No pertinent past medical history.  Past Surgical History:  Procedure Laterality Date   NO PAST SURGERIES      There were no vitals filed for this visit.   Subjective Assessment - 06/21/20 1025     Subjective Reports no falls or med change, has increased cadence at home during walking program    Patient is accompained by: Interpreter    Pertinent History Tyrece Vanterpool is a 70 y.o. male with no significant past medical history who presents 4/21 from [facility]  ER for stroke work-up due to left arm weakness and unsteady gait.  XAJ:OINOMVEHMC acute ischemia within the right MCA territory.    Limitations Walking    Patient Stated Goals To walk better                               [facility] Adult PT Treatment/Exercise - 06/21/20 0001       Lumbar Exercises: Standing   Other Standing Lumbar Exercises seated OH ball lift with breathing patterns, torso twists with ball 10x in ea. tandem stance position      Lumbar Exercises: Seated   Other Seated Lumbar Exercises core exercises of seated hip tosses, shoulder tosses, chops, Victories, 10 reps each with 4.4# ball followed  by latissimus pressdowns with inspiration x10    Other Seated Lumbar Exercises seated OH ball lift with breathing patterns, torso twiss with ball 10x      Knee/Hip Exercises: Aerobic   Other Aerobic Scifit L2.5 8' arms 8      Knee/Hip Exercises: Seated   Long Arc Quad Strengthening;Both;1 set    [employer] Limitations performed with latissimus press    Marching Strengthening;Both;1 set;15 reps;Limitations    Marching Limitations Performe with latissimus press                 Balance Exercises - 06/21/20 0001       Balance Exercises: Standing   Other Standing Exercises standing with foam roll support performing opposite UE/LE abd    Other Standing Exercises Comments standing dead lifts using 10# KB, 10 reps                 PT Short Term Goals - 06/19/20 1028       PT SHORT TERM GOAL #1   Title patient to demo initial HEP back to PT    Baseline 05/23/20 began 5x STS BID; 06/19/20 Patient able to demo HEP back w/o need of cuing    Time 4  Period Weeks    Status Achieved    Target Date 06/19/20      PT SHORT TERM GOAL #2   Title Assess BERG and establish appropriate goal    Baseline 05/23/20 BERG score 43, goal is 51    Time 4    Period Weeks    Status Achieved    Target Date 06/19/20      PT SHORT TERM GOAL #3   Title Assess FGA/DGI and set appropriate goal    Baseline FGA patially completed due to time constraints; 05/24/20 FGA score 13, goal is 21    Time 5    Period Weeks    Status Achieved    Target Date 06/19/20      PT SHORT TERM GOAL #4   Title Patient to ambulate 549ft w/o need of AD with SBA across outdoor surfaces    Baseline 1103ft in clinic under SBA w/o need of AD; 06/19/20 Ambulation of 55ft outside w/o ned of AD under S    Time 5    Period Weeks    Status Achieved    Target Date 06/19/20      PT SHORT TERM GOAL #5   Title Patient to negotiate full flight of stairs with most appropriate pattern and support    Baseline 4 steps with  single rail and step to pattern; 06/19/20 Able to negotiate 16steps with B rails and step through pattern under S    Time 5    Period Weeks    Status Achieved    Target Date 06/19/20               PT Long Term Goals - 06/19/20 1046       PT LONG TERM GOAL #1   Title patient to demo final HEP back to PT    Baseline TBD    Time 9    Period Weeks    Status New      PT LONG TERM GOAL #2   Title Patient to increase FOTO score to 65    Baseline Initial FOTO score 54    Time 9    Period Weeks    Status New      PT LONG TERM GOAL #3   Title Patient to demo gait velocity of 0.8 m/s w/o AD    Baseline 0.55 m/s w/o AD; 06/19/20 gait velocity 0.59 m/s    Time 9    Period Weeks    Status New      PT LONG TERM GOAL #4   Title Patient to ambulate 1024ft across outdoor surfaces w/o AD under S    Baseline 193ft across leel indoor surface w/o AD under SBA    Time 9    Period Weeks    Status New      PT LONG TERM GOAL #5   Title Patient to perform TUG w/o AD in 13s; 06/19/20 TUG w/o AD 15.4s    Baseline Initial TUG w/o AD 19.02s    Time 9    Period Weeks    Status On-going      PT LONG TERM GOAL #6   Title patient to perform 5x STS test w/o UE support in 15s    Baseline 5x STS score w/o UE assist 22.16s; 06/19/20 5x STS with UE support 21.1s    Time 9    Period Weeks    Status New  Plan - 06/21/20 1025     Clinical Impression Statement Todays session focused on continued core strengthening through functional activities to improve strength, balance and UE/LE dissociation, has increased cadence at home with walking program.  Contiues to guard during gait with diminished arm swing present.  Added balance challenges in standing tandem positions.    Personal Factors and Comorbidities Comorbidity 1    Comorbidities CVA    Examination-Activity Limitations Locomotion Level;Stairs    Examination-Participation Restrictions Occupation    Stability/Clinical  Decision Making Stable/Uncomplicated    Rehab Potential Good    PT Frequency 2x / week    PT Duration 8 weeks    PT Treatment/Interventions ADLs/Self Care Home Management;Aquatic Therapy;Gait training;Stair training;Functional mobility training;Therapeutic activities;Therapeutic exercise;Balance training;Neuromuscular re-education;Patient/family education    PT Next Visit Plan continue standing tasks in tandem and promote cross body reaching and balance training as well as U/LE dissociation with rotation, increase resistance and difficulty of tasks, encourage increased cadence with activity as well as try to relax tension and promote arm swing and confidence when walking    PT Home Exercise Plan [id number]    Consulted and Agree with Plan of Care Patient             Patient will benefit from skilled therapeutic intervention in order to improve the following deficits and impairments:  Abnormal gait, Difficulty walking, Decreased endurance, Decreased activity tolerance, Decreased balance, Decreased mobility, Decreased strength  Visit Diagnosis: Unsteadiness on feet  Other abnormalities of gait and mobility  Hemiplegia and hemiparesis following cerebral infarction affecting left non-dominant side [facility])     Problem List Patient Active Problem List   Diagnosis Date Noted   Stroke (HCC) 04/27/2020   Elevated blood-pressure reading without diagnosis of hypertension 04/27/2020    Hildred Laser PT 06/21/2020, 11:05 AM  [facility] [address] [place], [place], [address] Phone: [phone]   Fax:  [phone]  Name: Nichlas Pitera MRN: [id number] Date of Birth: 1950-04-11

## 2020-06-22 ENCOUNTER — Institutional Professional Consult (permissible substitution): Payer: Medicare HMO | Admitting: Internal Medicine

## 2020-06-22 ENCOUNTER — Encounter: Payer: Self-pay | Admitting: Internal Medicine

## 2020-06-22 ENCOUNTER — Ambulatory Visit: Payer: Medicare HMO | Admitting: Internal Medicine

## 2020-06-22 VITALS — BP 154/86 | HR 67 | Ht 71.0 in | Wt 249.0 lb

## 2020-06-22 DIAGNOSIS — I639 Cerebral infarction, unspecified: Secondary | ICD-10-CM | POA: Diagnosis not present

## 2020-06-22 HISTORY — PX: OTHER SURGICAL HISTORY: SHX169

## 2020-06-22 NOTE — Progress Notes (Signed)
Electrophysiology Office Note   Date:  06/22/2020   ID:  Randy Robertson, DOB 1950-09-15, MRN [id number]  PCP:  Pcp, No    Primary Electrophysiologist: Hillis Range, MD    CC:  stroke   History of Present Illness: Randy Robertson is a 70 y.o. male who presents today for electrophysiology evaluation.   He is referred by Dr Roda Shutters for EP consultation regarding stroke.  The patient had stroke 4/22 (notes reviewed).  He has made good recovery.  There is concern for afib as a possible cause.  Today, he denies symptoms of palpitations, chest pain, shortness of breath, orthopnea, PND, lower extremity edema, claudication, dizziness, presyncope, syncope, bleeding, or neurologic sequela. The patient is tolerating medications without difficulties and is otherwise without complaint today.    Past Medical History:  Diagnosis Date   Stroke (cerebrum) [facility])    Past Surgical History:  Procedure Laterality Date   NO PAST SURGERIES       Current Outpatient Medications  Medication Sig Dispense Refill   clopidogrel (PLAVIX) 75 MG tablet Take 75 mg by mouth daily.     atorvastatin (LIPITOR) 40 MG tablet Take 1 tablet (40 mg total) by mouth daily. 30 tablet 0   No current facility-administered medications for this visit.    Allergies:   Patient has no known allergies.   Social History:  The patient  reports that he has never smoked. He has never used smokeless tobacco. He reports that he does not drink alcohol and does not use drugs.   Family History:  The patient's family history includes Hypertension in an other family member.    ROS:  Please see the history of present illness.   All other systems are personally reviewed and negative.    PHYSICAL EXAM: VS:  BP (!) 154/86   Pulse 67   Ht 5\' 11"  (1.803 m)   Wt 249 lb (112.9 kg)   SpO2 97%   BMI 34.73 kg/m  , BMI Body mass index is 34.73 kg/m. GEN: Well nourished, well developed, in no acute distress HEENT: normal Neck: no JVD,  carotid bruits, or masses Cardiac: RRR; no murmurs, rubs, or gallops,no edema  Respiratory:  clear to auscultation bilaterally, normal work of breathing GI: soft, nontender, nondistended, + BS MS: no deformity or atrophy Skin: warm and dry  Neuro:  Strength and sensation are intact Psych: euthymic mood, full affect  EKG:  EKG is ordered today. The ekg ordered today is personally reviewed and shows sinus with RBBB   Recent Labs: 04/26/2020: ALT 39; BUN 10; Creatinine, Ser 0.72; Hemoglobin 15.0; Platelets 219; Potassium 3.7; Sodium 136  personally reviewed   Lipid Panel     Component Value Date/Time   CHOL 194 04/27/2020 0411   CHOL 174 03/11/2018 1027   TRIG 99 04/27/2020 0411   HDL 42 04/27/2020 0411   HDL 40 03/11/2018 1027   CHOLHDL 4.6 04/27/2020 0411   VLDL 20 04/27/2020 0411   LDLCALC 132 (H) 04/27/2020 0411   LDLCALC 115 (H) 03/11/2018 1027   personally reviewed   Wt Readings from Last 3 Encounters:  06/22/20 249 lb (112.9 kg)  05/21/20 248 lb 9.6 oz (112.8 kg)  04/26/20 261 lb 0.4 oz (118.4 kg)    Echo 04/27/20- EF 60%, moderate LA enlargment  Other studies personally reviewed: Additional studies/ records that were reviewed today include: Dr 04/29/20 notes  Review of the above records today demonstrates: as above   Assessment and Plan:  1. Cryptogenic stroke  The patient presents with cryptogenic stroke.    I spoke at length with the patient about monitoring for afib  an implantable loop recorder.  Risks, benefits, and alteratives to implantable loop recorder were discussed with the patient today.   At this time, the patient is very clear in their decision to proceed with implantable loop recorder.   We will proceed at this time.       PROCEDURES:   1. Implantable loop recorder implantation        DESCRIPTION OF PROCEDURE:  Informed written consent was obtained.  The patient required no sedation for the procedure today.  The patients left chest was prepped and  draped. Mapping over the patient's chest was performed to identify the appropriate ILR site.  This area was found to be the left  parasternal region over the 3rd-4th intercostal space.  The skin overlying this region was infiltrated with lidocaine for local analgesia.  A 0.5-cm incision was made at the implant site.  A subcutaneous ILR pocket was fashioned using a combination of sharp and blunt dissection.  A Medtronic Reveal Linq model [id number] implantable loop recorder  (SN [id number]) was then placed into the pocket R waves were very prominent and measured > 0.2 mV. EBL<1 ml.  Steri- Strips and a sterile dressing were then applied.  There were no early apparent complications.     CONCLUSIONS:   1. Successful implantation of a Medtronic Reveal LINQ implantable loop recorder for cryptogenic stroke  2. No early apparent complications.   Hillis Range MD, [facility] 06/22/2020 10:02 AM

## 2020-06-22 NOTE — Patient Instructions (Addendum)
Medication Instructions:  Your physician recommends that you continue on your current medications as directed. Please refer to the Current Medication list given to you today.  Labwork: None ordered.  Testing/Procedures: None ordered.  Follow-Up:  Your physician wants you to follow-up in: as needed with Dr. Johney Frame. He will follow your device remotely.     Implantable Loop Recorder Placement, Care After This sheet gives you information about how to care for yourself after your procedure. Your health care provider may also give you more specific instructions. If you have problems or questions, contact your health care provider. What can I expect after the procedure? After the procedure, it is common to have: Soreness or discomfort near the incision. Some swelling or bruising near the incision.  Follow these instructions at home: Incision care   Leave your outer dressing on for 24 hours.  After 24 hours you can remove your outer dressing and shower. Leave adhesive strips in place. These skin closures may need to stay in place for 1-2 weeks. If adhesive strip edges start to loosen and curl up, you may trim the loose edges.  You may remove the strips if they have not fallen off after 2 weeks. Check your incision area every day for signs of infection. Check for: Redness, swelling, or pain. Fluid or blood. Warmth. Pus or a bad smell. Do not take baths, swim, or use a hot tub until your incision is completely healed. If your wound site starts to bleed apply pressure.      If you have any questions/concerns please call the device clinic at [phone].  Activity  Return to your normal activities.  General instructions Follow instructions from your health care provider about how to manage your implantable loop recorder and transmit the information. Learn how to activate a recording if this is necessary for your type of device. Do not go through a metal detection gate, and do not let  someone hold a metal detector over your chest. Show your ID card. Do not have an MRI unless you check with your health care provider first. Take over-the-counter and prescription medicines only as told by your health care provider. Keep all follow-up visits as told by your health care provider. This is important. Contact a health care provider if: You have redness, swelling, or pain around your incision. You have a fever. You have pain that is not relieved by your pain medicine. You have triggered your device because of fainting (syncope) or because of a heartbeat that feels like it is racing, slow, fluttering, or skipping (palpitations). Get help right away if you have: Chest pain. Difficulty breathing. Summary After the procedure, it is common to have soreness or discomfort near the incision. Change your dressing as told by your health care provider. Follow instructions from your health care provider about how to manage your implantable loop recorder and transmit the information. Keep all follow-up visits as told by your health care provider. This is important. This information is not intended to replace advice given to you by your health care provider. Make sure you discuss any questions you have with your health care provider. Document Released: 12/04/2014 Document Revised: 02/07/2017 Document Reviewed: 02/07/2017 Elsevier Patient Education  2020 [employer].

## 2020-06-25 ENCOUNTER — Institutional Professional Consult (permissible substitution): Payer: Medicare HMO | Admitting: Internal Medicine

## 2020-06-26 ENCOUNTER — Encounter: Payer: Self-pay | Admitting: Adult Health

## 2020-06-26 ENCOUNTER — Ambulatory Visit: Payer: Medicare HMO

## 2020-06-26 ENCOUNTER — Ambulatory Visit: Payer: Medicare HMO | Admitting: Adult Health

## 2020-06-26 ENCOUNTER — Other Ambulatory Visit: Payer: Self-pay

## 2020-06-26 VITALS — BP 160/82 | HR 82 | Ht 71.0 in | Wt 249.0 lb

## 2020-06-26 DIAGNOSIS — E785 Hyperlipidemia, unspecified: Secondary | ICD-10-CM | POA: Diagnosis not present

## 2020-06-26 DIAGNOSIS — R269 Unspecified abnormalities of gait and mobility: Secondary | ICD-10-CM

## 2020-06-26 DIAGNOSIS — I639 Cerebral infarction, unspecified: Secondary | ICD-10-CM

## 2020-06-26 DIAGNOSIS — R2681 Unsteadiness on feet: Secondary | ICD-10-CM | POA: Diagnosis not present

## 2020-06-26 DIAGNOSIS — I69398 Other sequelae of cerebral infarction: Secondary | ICD-10-CM

## 2020-06-26 DIAGNOSIS — I10 Essential (primary) hypertension: Secondary | ICD-10-CM | POA: Diagnosis not present

## 2020-06-26 DIAGNOSIS — I69354 Hemiplegia and hemiparesis following cerebral infarction affecting left non-dominant side: Secondary | ICD-10-CM | POA: Diagnosis not present

## 2020-06-26 DIAGNOSIS — R2689 Other abnormalities of gait and mobility: Secondary | ICD-10-CM | POA: Diagnosis not present

## 2020-06-26 MED ORDER — CLOPIDOGREL BISULFATE 75 MG PO TABS: 75.0000 mg | ORAL_TABLET | Freq: Every day | ORAL | 3 refills | Status: DC

## 2020-06-26 MED ORDER — ATORVASTATIN CALCIUM 40 MG PO TABS: 40.0000 mg | ORAL_TABLET | Freq: Every day | ORAL | 3 refills | Status: DC

## 2020-06-26 MED ORDER — AMLODIPINE BESYLATE 2.5 MG PO TABS: 2.5000 mg | ORAL_TABLET | Freq: Every day | ORAL | 5 refills | Status: DC

## 2020-06-26 NOTE — Therapy (Signed)
[facility] [address] [place], [place], [address] Phone: [phone]   Fax:  [phone]  Physical Therapy Treatment  Patient Details  Name: Randy Robertson MRN: [id number] Date of Birth: May 09, 1950 Referring Provider (PT): Otilio Carpen MD   Encounter Date: 06/26/2020   PT End of Session - 06/26/20 1534     Visit Number 12    Number of Visits 16    Date for PT Re-Evaluation 07/03/20    Authorization Type [employer]    Progress Note Due on Visit 10    PT Start Time 1530    PT Stop Time 1615    PT Time Calculation (min) 45 min    Equipment Utilized During Treatment Gait belt    Activity Tolerance Patient tolerated treatment well    Behavior During Therapy [facility] for tasks assessed/performed             Past Medical History:  Diagnosis Date   Stroke (cerebrum) [facility])     Past Surgical History:  Procedure Laterality Date   implantable loop recorder placement  06/22/2020   Medtronic Reveal Linq model [id number] implantable loop recorder  (SN [id number])  for cryptogenic stroke   NO PAST SURGERIES      There were no vitals filed for this visit.   Subjective Assessment - 06/26/20 1543     Subjective Saw MD this AM and is doing well, meds updated as noted    Patient is accompained by: Interpreter    Pertinent History Randy Robertson is a 70 y.o. male with no significant past medical history who presents 4/21 from [facility]  ER for stroke work-up due to left arm weakness and unsteady gait.  SHF:[id number] acute ischemia within the right MCA territory.    Limitations Walking    Patient Stated Goals To walk better                               [facility] Adult PT Treatment/Exercise - 06/26/20 0001       Lumbar Exercises: Standing   Other Standing Lumbar Exercises standing OH ball lift with breathing patterns, torso twists with ball 12x in ea. tandem stance position      Lumbar Exercises:  Seated   Other Seated Lumbar Exercises core exercises of seated hip tosses, shoulder tosses, chops, Victories, 12 reps each with 4.4# ball followed by latissimus pressdowns with inspiration x12    Other Seated Lumbar Exercises seated OH ball lift with breathing patterns, torso twiss with ball 10x      Knee/Hip Exercises: Aerobic   Other Aerobic Scifit L3 8' arms 8      Knee/Hip Exercises: Seated   Long Arc Quad 15 reps;Both    Long [employer] Limitations performed with latissimus press and 1# wts    Marching Strengthening;Both;1 set;15 reps;Limitations    Marching Limitations Performe with latissimus press and 1# wt                 Balance Exercises - 06/26/20 0001       Balance Exercises: Standing   Other Standing Exercises standing with foam roll support performing opposite UE/LE abd, 15x    Other Standing Exercises Comments Tandem satnce with OH ball lift, 10x in ea. posiiton                 PT Short Term Goals - 06/19/20 1028       PT SHORT TERM  GOAL #1   Title patient to demo initial HEP back to PT    Baseline 05/23/20 began 5x STS BID; 06/19/20 Patient able to demo HEP back w/o need of cuing    Time 4    Period Weeks    Status Achieved    Target Date 06/19/20      PT SHORT TERM GOAL #2   Title Assess BERG and establish appropriate goal    Baseline 05/23/20 BERG score 43, goal is 51    Time 4    Period Weeks    Status Achieved    Target Date 06/19/20      PT SHORT TERM GOAL #3   Title Assess FGA/DGI and set appropriate goal    Baseline FGA patially completed due to time constraints; 05/24/20 FGA score 13, goal is 21    Time 5    Period Weeks    Status Achieved    Target Date 06/19/20      PT SHORT TERM GOAL #4   Title Patient to ambulate 560ft w/o need of AD with SBA across outdoor surfaces    Baseline 128ft in clinic under SBA w/o need of AD; 06/19/20 Ambulation of 547ft outside w/o ned of AD under S    Time 5    Period Weeks    Status Achieved     Target Date 06/19/20      PT SHORT TERM GOAL #5   Title Patient to negotiate full flight of stairs with most appropriate pattern and support    Baseline 4 steps with single rail and step to pattern; 06/19/20 Able to negotiate 16steps with B rails and step through pattern under S    Time 5    Period Weeks    Status Achieved    Target Date 06/19/20               PT Long Term Goals - 06/19/20 1046       PT LONG TERM GOAL #1   Title patient to demo final HEP back to PT    Baseline TBD    Time 9    Period Weeks    Status New      PT LONG TERM GOAL #2   Title Patient to increase FOTO score to 65    Baseline Initial FOTO score 54    Time 9    Period Weeks    Status New      PT LONG TERM GOAL #3   Title Patient to demo gait velocity of 0.8 m/s w/o AD    Baseline 0.55 m/s w/o AD; 06/19/20 gait velocity 0.59 m/s    Time 9    Period Weeks    Status New      PT LONG TERM GOAL #4   Title Patient to ambulate 1017ft across outdoor surfaces w/o AD under S    Baseline 129ft across leel indoor surface w/o AD under SBA    Time 9    Period Weeks    Status New      PT LONG TERM GOAL #5   Title Patient to perform TUG w/o AD in 13s; 06/19/20 TUG w/o AD 15.4s    Baseline Initial TUG w/o AD 19.02s    Time 9    Period Weeks    Status On-going      PT LONG TERM GOAL #6   Title patient to perform 5x STS test w/o UE support in 15s    Baseline 5x STS score w/o  UE assist 22.16s; 06/19/20 5x STS with UE support 21.1s    Time 9    Period Weeks    Status New                   Plan - 06/26/20 1545     Clinical Impression Statement Todays session focused on continued balance and trunk strengthening, increasing cadence, increased reps and resistance as noted, performed  standing tasks in tandem and offset positions, continues to demo slower cadence out of fall avoidance    Personal Factors and Comorbidities Comorbidity 1    Comorbidities CVA    Examination-Activity Limitations  Locomotion Level;Stairs    Examination-Participation Restrictions Occupation    Stability/Clinical Decision Making Stable/Uncomplicated    Rehab Potential Good    PT Frequency 2x / week    PT Duration 8 weeks    PT Treatment/Interventions ADLs/Self Care Home Management;Aquatic Therapy;Gait training;Stair training;Functional mobility training;Therapeutic activities;Therapeutic exercise;Balance training;Neuromuscular re-education;Patient/family education    PT Next Visit Plan Continue to emphasize tasks to improve balance and cadence as well as confidence    PT Home Exercise Plan [id number]    Consulted and Agree with Plan of Care Patient             Patient will benefit from skilled therapeutic intervention in order to improve the following deficits and impairments:  Abnormal gait, Difficulty walking, Decreased endurance, Decreased activity tolerance, Decreased balance, Decreased mobility, Decreased strength  Visit Diagnosis: Unsteadiness on feet  Other abnormalities of gait and mobility  Hemiplegia and hemiparesis following cerebral infarction affecting left non-dominant side [facility])     Problem List Patient Active Problem List   Diagnosis Date Noted   Stroke (HCC) 04/27/2020   Elevated blood-pressure reading without diagnosis of hypertension 04/27/2020    Hildred Laser PT 06/26/2020, 4:16 PM  [facility] [address] [place], [place], [address] Phone: [phone]   Fax:  [phone]  Name: Randy Robertson MRN: [id number] Date of Birth: 06/01/1950

## 2020-06-26 NOTE — Progress Notes (Signed)
[facility] [address] [place]. [place] [address] [phone]       HOSPITAL FOLLOW UP NOTE  [name] Date of Birth:  [birth year] Medical Record Number:  [id number]   Reason for Referral:  hospital stroke follow up    SUBJECTIVE:   CHIEF COMPLAINT:  Chief Complaint  Patient presents with   Follow-up    Treatment room with daughter Randy Robertson  Pt is well, has some pressure behind head, some L side tingling, imbalance, some blurred vision but not much. BP has been high since stroke  Overall doing much better    HPI:   [name] is a 70 y.o. male with history of prediabetes who presented on 04/26/2020 with left arm numbness from elbow to fingers, blurry vision, and unstable gait.  Personally reviewed hospitalization pertinent progress notes, lab work and imaging summary provided.  Stroke work-up reveals multifocal acute right MCA infarcts embolic secondary to unclear source.  Recommend placement of loop recorder outpatient to rule out cardioembolic source. 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  EF 60 to 65%.  LDL 132, A1c 5.7.  Recommended DAPT for 3 weeks and aspirin alone as well as adding atorvastatin 40 mg daily.  Evaluated by therapies who recommended outpatient PT and discharged home in stable condition.  Today, 06/26/2020, [name] is being seen for hospital follow-up accompanied by his daughter who assists with interpreting.  Reports residual left-sided numbness/tingling, mild imbalance and blurred vision but overall greatly improving. Currently working with neuro rehab PT. 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  Denies new stroke/TIA symptoms.  Has remained on both aspirin and Plavix as well as atorvastatin without associated side effects.  Blood pressure today 160/82.  Reports blood pressure has been elevated since hospital discharge typically ranging 160s/90s.  He did not  have any issues with blood pressure management prior to his stroke.  Plans on establishing care with new PCP on 7/21.  He was seen by Dr. Johney Frame 6/17 and had loop recorder placed.  No further concerns at this time.     ROS:   14 system review of systems performed and negative with exception of those listed in HPI  PMH:  Past Medical History:  Diagnosis Date   Stroke (cerebrum) (HCC)     PSH:  Past Surgical History:  Procedure Laterality Date   implantable loop recorder placement  06/22/2020   Medtronic Reveal Linq model [id number] implantable loop recorder  (SN [id number])  for cryptogenic stroke   NO PAST SURGERIES      Social History:  Social History   Socioeconomic History   Marital status: Married    Spouse name: Not on file   Number of children: Not on file   Years of education: Not on file   Highest education level: Not on file  Occupational History   Not on file  Tobacco Use   Smoking status: Never   Smokeless tobacco: Never  Substance and Sexual Activity   Alcohol use: Never   Drug use: Never   Sexual activity: Not on file  Other Topics Concern   Not on file  Social History Narrative   Lives in [place]   Social Determinants of Health   Financial Resource Strain: Not on file  Food Insecurity: Not on file  Transportation Needs: Not on file  Physical Activity: Not on file  Stress: Not on file  Social Connections: Not on file  Intimate  Partner Violence: Not on file    Family History:  Family History  Problem Relation Age of Onset   Hypertension Other     Medications:   No current outpatient medications on file prior to visit.   No current facility-administered medications on file prior to visit.    Allergies:  No Known Allergies    OBJECTIVE:  Physical Exam  Vitals:   06/26/20 1408  BP: (!) 160/82  Pulse: 82  Weight: 249 lb (112.9 kg)  Height: 5\' 11"  (1.803 m)   Body mass index is 34.73 kg/m. No results found.   Post stroke PHQ  2/9 Depression screen PHQ 2/9 06/26/2020  Decreased Interest 0  Down, Depressed, Hopeless 0  PHQ - 2 Score 0     General: well developed, well nourished, very pleasant middle-age male, seated, in no evident distress Head: head normocephalic and atraumatic.   Neck: supple with no carotid or supraclavicular bruits Cardiovascular: regular rate and rhythm, no murmurs Musculoskeletal: no deformity Skin:  no rash/petichiae Vascular:  Normal pulses all extremities   Neurologic Exam Mental Status: Awake and fully alert.  Denies speech or language difficulties but difficulty fully assessing as his primary language is 06/28/2020.  Oriented to place and time. Recent and remote memory intact. Attention span, concentration and fund of knowledge appropriate. Mood and affect appropriate.  Cranial Nerves: Fundoscopic exam reveals sharp disc margins. Pupils equal, briskly reactive to light. Extraocular movements full without nystagmus. Visual fields full to confrontation. Hearing intact. Facial sensation intact. Face, tongue, palate moves normally and symmetrically.  Motor: Normal bulk and tone. Normal strength in all tested extremity muscles Sensory.:  Decreased sensory left upper and lower extremity compared to right side Coordination: Rapid alternating movements normal in all extremities. Finger-to-nose and heel-to-shin performed accurately bilaterally. Gait and Station: Arises from chair without difficulty. Stance is normal. Gait demonstrates normal stride length and mild imbalance and mild favoring of left leg without use of assistive device.  Unable to perform tandem walk and heel toe.  Reflexes: 1+ and symmetric. Toes downgoing.     NIHSS  1 Modified Rankin  2      ASSESSMENT: Randy Robertson is a 70 y.o. year old male with recent multifocal acute right MCA infarcts, embolic secondary to unclear source on 04/26/2020 presenting with left arm numbness from elbow to fingers, blurred vision and gait  impairment.  Loop recorder placed 06/22/2020 by Dr. 06/24/2020.  Vascular risk factors include HLD and advanced age.      PLAN:  R MCA stroke, cryptogenic:  Residual deficit: Left-sided numbness/tingling, gait impairment with imbalance and blurred vision but overall greatly improving since discharge.  Continue therapies. Will assist with extending short-term disability for 3 months for further recovery time that was initiated during hospitalization -daughter is currently working on obtaining paperwork.   S/p ILR 06/22/2020.  Continue Plavix and atorvastatin 40 mg daily for secondary stroke prevention.  Advised to discontinue aspirin as 3 weeks DAPT completed.  Refills provided  Discussed secondary stroke prevention measures and importance of close PCP follow up for aggressive stroke risk factor management  HTN: BP goal <130/90.  Currently uncontrolled -recommend starting amlodipine 2.5 mg daily and to continue to monitor at home. F/u with PCP on 7/21 to further evaluate and manage HLD: LDL goal <70. Recent LDL 132 -currently on atorvastatin 40 mg daily.     Follow up in 2 months or call earlier if needed   CC:  [facility] provider: Dr. 8/21   I  spent 54 minutes of face-to-face and non-face-to-face time with patient and daughter.  This included previsit chart review including extensive review of recent hospitalization, lab review, study review, order entry, electronic health record documentation, and extensive patient and daughter education and discussion regarding recent stroke and potential etiologies as well as secondary stroke prevention measures and, importance of managing stroke risk factors, residual deficits and typical recovery time and answered all other questions to patient and daughters satisfaction   Ihor Austin, AGNP-BC  [facility] [address] [place], [place] [address]  Phone [phone] Fax [phone] Note: This document was prepared with  digital dictation and possible smart phrase technology. Any transcriptional errors that result from this process are unintentional.

## 2020-06-26 NOTE — Patient Instructions (Signed)
Continue working with therapies for likely ongoing recovery - we will further assist with disability  Continue clopidogrel 75 mg daily  and plavix  for secondary stroke prevention  Stop aspirin as 3 week duration completed   Continue to follow up with PCP regarding cholesterol and blood pressure management  Maintain strict control of hypertension with blood pressure goal below 130/90 and cholesterol with LDL cholesterol (bad cholesterol) goal below 70 mg/dL.       Followup in the future with me in 3 months or call earlier if needed       Thank you for coming to see [place] at [facility]. I hope we have been able to provide you high quality care today.  You may receive a patient satisfaction survey over the next few weeks. We would appreciate your feedback and comments so that we may continue to improve ourselves and the health of our patients.

## 2020-06-28 ENCOUNTER — Ambulatory Visit: Payer: Medicare HMO

## 2020-06-28 ENCOUNTER — Other Ambulatory Visit: Payer: Self-pay

## 2020-06-28 DIAGNOSIS — R2689 Other abnormalities of gait and mobility: Secondary | ICD-10-CM

## 2020-06-28 DIAGNOSIS — I69354 Hemiplegia and hemiparesis following cerebral infarction affecting left non-dominant side: Secondary | ICD-10-CM

## 2020-06-28 DIAGNOSIS — R2681 Unsteadiness on feet: Secondary | ICD-10-CM | POA: Diagnosis not present

## 2020-06-28 NOTE — Therapy (Signed)
[facility] [address] [place], [place], [address] Phone: [phone]   Fax:  [phone]  Physical Therapy Treatment  Patient Details  Name: Randy Robertson MRN: [id number] Date of Birth: 02-03-1950 Referring Provider (PT): Otilio Carpen MD   Encounter Date: 06/28/2020   PT End of Session - 06/28/20 1028     Visit Number 13    Number of Visits 16    Date for PT Re-Evaluation 07/03/20    Authorization Type [employer]    Progress Note Due on Visit 10    PT Start Time 1020    PT Stop Time 1100    PT Time Calculation (min) 40 min    Equipment Utilized During Treatment Gait belt    Activity Tolerance Patient tolerated treatment well    Behavior During Therapy [facility] for tasks assessed/performed             Past Medical History:  Diagnosis Date   Stroke (cerebrum) [facility])     Past Surgical History:  Procedure Laterality Date   implantable loop recorder placement  06/22/2020   Medtronic Reveal Linq model [id number] implantable loop recorder  (SN [id number])  for cryptogenic stroke   NO PAST SURGERIES      There were no vitals filed for this visit.   Subjective Assessment - 06/28/20 1027     Subjective No new c/o, no falls or LOB episodes to note    Patient is accompained by: Interpreter    Pertinent History Randy Robertson is a 70 y.o. male with no significant past medical history who presents 4/21 from [facility]  ER for stroke work-up due to left arm weakness and unsteady gait.  OXB:DZHGDJMEQA acute ischemia within the right MCA territory.    Limitations Walking    Patient Stated Goals To walk better                               [facility] Adult PT Treatment/Exercise - 06/28/20 0001       Lumbar Exercises: Seated   Other Seated Lumbar Exercises core exercises of seated hip tosses, shoulder tosses, chops, Victories, 10 reps each with 5.5# ball followed by latissimus pressdowns with  inspiration x15      Knee/Hip Exercises: Aerobic   Other Aerobic Scifit L3 8' arms 8      Knee/Hip Exercises: Seated   Long Arc Quad 15 reps;Both    Long Arc Quad Weight 1 lbs.    Long [employer] Limitations performed with latissimus press and 1# wts                 Balance Exercises - 06/28/20 0001       Balance Exercises: Standing   Marching Solid surface;Upper extremity assist 1;Forwards;Retro;Other reps (comment);Limitations    Marching Limitations 3 trips in // bars with fingertip support, march walking    Other Standing Exercises standing with foam roll support performing opposite UE/LE abd, 15x, 1# at ankles    Other Standing Exercises Comments Tandem satnce with OH ball lift, circles with 5.5# 10x in ea. posiiton,                 PT Short Term Goals - 06/19/20 1028       PT SHORT TERM GOAL #1   Title patient to demo initial HEP back to PT    Baseline 05/23/20 began 5x STS BID; 06/19/20 Patient able to demo HEP back w/o  need of cuing    Time 4    Period Weeks    Status Achieved    Target Date 06/19/20      PT SHORT TERM GOAL #2   Title Assess BERG and establish appropriate goal    Baseline 05/23/20 BERG score 43, goal is 51    Time 4    Period Weeks    Status Achieved    Target Date 06/19/20      PT SHORT TERM GOAL #3   Title Assess FGA/DGI and set appropriate goal    Baseline FGA patially completed due to time constraints; 05/24/20 FGA score 13, goal is 21    Time 5    Period Weeks    Status Achieved    Target Date 06/19/20      PT SHORT TERM GOAL #4   Title Patient to ambulate 532ft w/o need of AD with SBA across outdoor surfaces    Baseline 174ft in clinic under SBA w/o need of AD; 06/19/20 Ambulation of 520ft outside w/o ned of AD under S    Time 5    Period Weeks    Status Achieved    Target Date 06/19/20      PT SHORT TERM GOAL #5   Title Patient to negotiate full flight of stairs with most appropriate pattern and support    Baseline 4  steps with single rail and step to pattern; 06/19/20 Able to negotiate 16steps with B rails and step through pattern under S    Time 5    Period Weeks    Status Achieved    Target Date 06/19/20               PT Long Term Goals - 06/19/20 1046       PT LONG TERM GOAL #1   Title patient to demo final HEP back to PT    Baseline TBD    Time 9    Period Weeks    Status New      PT LONG TERM GOAL #2   Title Patient to increase FOTO score to 65    Baseline Initial FOTO score 54    Time 9    Period Weeks    Status New      PT LONG TERM GOAL #3   Title Patient to demo gait velocity of 0.8 m/s w/o AD    Baseline 0.55 m/s w/o AD; 06/19/20 gait velocity 0.59 m/s    Time 9    Period Weeks    Status New      PT LONG TERM GOAL #4   Title Patient to ambulate 1092ft across outdoor surfaces w/o AD under S    Baseline 126ft across leel indoor surface w/o AD under SBA    Time 9    Period Weeks    Status New      PT LONG TERM GOAL #5   Title Patient to perform TUG w/o AD in 13s; 06/19/20 TUG w/o AD 15.4s    Baseline Initial TUG w/o AD 19.02s    Time 9    Period Weeks    Status On-going      PT LONG TERM GOAL #6   Title patient to perform 5x STS test w/o UE support in 15s    Baseline 5x STS score w/o UE assist 22.16s; 06/19/20 5x STS with UE support 21.1s    Time 9    Period Weeks    Status New  Plan - 06/28/20 1028     Clinical Impression Statement Todays session concentrated on dynamic balance activities designed to increase SLS time to build confidence in increasing gait speed.  Tandem stance activities with UE tasks, marching in // bars minimizing UE support    Personal Factors and Comorbidities Comorbidity 1    Comorbidities CVA    Examination-Activity Limitations Locomotion Level;Stairs    Examination-Participation Restrictions Occupation    Stability/Clinical Decision Making Stable/Uncomplicated    Rehab Potential Good    PT Frequency 2x /  week    PT Duration 8 weeks    PT Treatment/Interventions ADLs/Self Care Home Management;Aquatic Therapy;Gait training;Stair training;Functional mobility training;Therapeutic activities;Therapeutic exercise;Balance training;Neuromuscular re-education;Patient/family education    PT Next Visit Plan contnue balance training to improve cadence and confidence, CW/CCW patterns with ball in tandem stance, standing reaching in all direction    PT Home Exercise Plan [id number]    Consulted and Agree with Plan of Care Patient             Patient will benefit from skilled therapeutic intervention in order to improve the following deficits and impairments:  Abnormal gait, Difficulty walking, Decreased endurance, Decreased activity tolerance, Decreased balance, Decreased mobility, Decreased strength  Visit Diagnosis: Unsteadiness on feet  Other abnormalities of gait and mobility  Hemiplegia and hemiparesis following cerebral infarction affecting left non-dominant side [facility])     Problem List Patient Active Problem List   Diagnosis Date Noted   Stroke (HCC) 04/27/2020   Elevated blood-pressure reading without diagnosis of hypertension 04/27/2020    Hildred Laser 06/28/2020, 10:59 AM  [facility] [address] [place], [place], [address] Phone: [phone]   Fax:  [phone]  Name: Alexandar Weisenberger MRN: [id number] Date of Birth: 07-26-1950

## 2020-07-03 ENCOUNTER — Ambulatory Visit: Payer: Medicare HMO

## 2020-07-03 ENCOUNTER — Other Ambulatory Visit: Payer: Self-pay

## 2020-07-03 DIAGNOSIS — I69354 Hemiplegia and hemiparesis following cerebral infarction affecting left non-dominant side: Secondary | ICD-10-CM

## 2020-07-03 DIAGNOSIS — R2681 Unsteadiness on feet: Secondary | ICD-10-CM | POA: Diagnosis not present

## 2020-07-03 DIAGNOSIS — R2689 Other abnormalities of gait and mobility: Secondary | ICD-10-CM

## 2020-07-03 NOTE — Therapy (Signed)
[facility] [address] [place], [place], [address] Phone: [phone]   Fax:  [phone]  Physical Therapy Treatment  Patient Details  Name: Randy Robertson MRN: [id number] Date of Birth: 03-20-1950 Referring Provider (PT): Otilio Carpen MD   Encounter Date: 07/03/2020   PT End of Session - 07/03/20 1116     Visit Number 14    Number of Visits 16    Date for PT Re-Evaluation 07/03/20    Authorization Type [employer] Medicare    Progress Note Due on Visit 10    PT Start Time 1100    PT Stop Time 1145    PT Time Calculation (min) 45 min    Equipment Utilized During Treatment Gait belt    Activity Tolerance Patient tolerated treatment well    Behavior During Therapy [facility] for tasks assessed/performed             Past Medical History:  Diagnosis Date   St[facility]rebrum[facility]     Past Surgical History:  Procedure Laterality Date   implantable loop recorder placement  06/22/2020   Medtronic Reveal Linq model [id number] implantable loop recorder  (SN [id number])  for cryptogenic stroke   NO PAST SURGERIES      There were no vitals filed for this visit.   Subjective Assessment - 07/03/20 1105     Subjective No new c/o, no falls or LOB episodes to note.  Continues walking program at home, occasional drift to L but no LOB.  has returned to short distance driving    Patient is accompained by: Interpreter    Pertinent History Randy Robertson is a 70 y.o. male with no significant past medical history who presents 4/21 from [facility]  ER for stroke work-up due to left arm weakness and unsteady gait.  INO:MVEHMCNOBS acute ischemia within the right MCA territory.    Limitations Walking    Patient Stated Goals To walk better    Currently in Pain? No/denies                               [facility] Adult PT Treatment/Exercise - 07/03/20 0001       Lumbar Exercises: Seated   Other Seated Lumbar Exercises  core exercises of seated hip tosses, shoulder tosses, chops, Victories, 10 reps each with 5.5# ball followed by latissimus pressdowns with inspiration x15      Knee/Hip Exercises: Aerobic   Other Aerobic Scifit L3 12' arms 8(patient did not stop at 8 min)      Knee/Hip Exercises: Seated   Long Arc Quad 15 reps;Both    Long Arc Quad Weight 2 lbs.    Long [employer] Limitations performed with latissimus press and 1# wts                 Balance Exercises - 07/03/20 0001       Balance Exercises: Standing   Other Standing Exercises standing with foam roll support performing opposite UE/LE abd, 15x, 2# at ankles    Other Standing Exercises Comments Tandem stance with OH ball lift, circles with 5.5# 5x in ea. posiiton, CW/CCW                 PT Short Term Goals - 06/19/20 1028       PT SHORT TERM GOAL #1   Title patient to demo initial HEP back to PT    Baseline 05/23/20 began 5x STS BID;  06/19/20 Patient able to demo HEP back w/o need of cuing    Time 4    Period Weeks    Status Achieved    Target Date 06/19/20      PT SHORT TERM GOAL #2   Title Assess BERG and establish appropriate goal    Baseline 05/23/20 BERG score 43, goal is 51    Time 4    Period Weeks    Status Achieved    Target Date 06/19/20      PT SHORT TERM GOAL #3   Title Assess FGA/DGI and set appropriate goal    Baseline FGA patially completed due to time constraints; 05/24/20 FGA score 13, goal is 21    Time 5    Period Weeks    Status Achieved    Target Date 06/19/20      PT SHORT TERM GOAL #4   Title Patient to ambulate 532ft w/o need of AD with SBA across outdoor surfaces    Baseline 150ft in clinic under SBA w/o need of AD; 06/19/20 Ambulation of 554ft outside w/o ned of AD under S    Time 5    Period Weeks    Status Achieved    Target Date 06/19/20      PT SHORT TERM GOAL #5   Title Patient to negotiate full flight of stairs with most appropriate pattern and support    Baseline 4 steps  with single rail and step to pattern; 06/19/20 Able to negotiate 16steps with B rails and step through pattern under S    Time 5    Period Weeks    Status Achieved    Target Date 06/19/20               PT Long Term Goals - 07/03/20 1120       PT LONG TERM GOAL #1   Title patient to demo final HEP back to PT    Baseline TBD; 07/03/20 HEP reviewed verbally    Time 9    Period Weeks    Status On-going      PT LONG TERM GOAL #2   Title Patient to increase FOTO score to 65    Baseline Initial FOTO score 54    Time 9    Period Weeks    Status New      PT LONG TERM GOAL #3   Title Patient to demo gait velocity of 0.8 m/s w/o AD    Baseline 0.55 m/s w/o AD; 06/19/20 gait velocity 0.59 m/s    Time 9    Period Weeks    Status New      PT LONG TERM GOAL #4   Title Patient to ambulate 1052ft across outdoor surfaces w/o AD under S    Baseline 145ft across leel indoor surface w/o AD under SBA    Time 9    Period Weeks    Status New      PT LONG TERM GOAL #5   Title Patient to perform TUG w/o AD in 13s; 06/19/20 TUG w/o AD 15.4s    Baseline Initial TUG w/o AD 19.02s    Time 9    Period Weeks    Status On-going      PT LONG TERM GOAL #6   Title patient to perform 5x STS test w/o UE support in 15s    Baseline 5x STS score w/o UE assist 22.16s; 06/19/20 5x STS with UE support 21.1s    Time 9  Period Weeks    Status New                   Plan - 07/03/20 1134     Clinical Impression Statement Continued core and balance training, added weight to ankles to increase difficulty and challenge patient, incorporated CW/CCW circles in tandem standing, patient showing incresed cadence with ambulation in clinic    Personal Factors and Comorbidities Comorbidity 1    Comorbidities CVA    Examination-Activity Limitations Locomotion Level;Stairs    Examination-Participation Restrictions Occupation    Stability/Clinical Decision Making Stable/Uncomplicated    Rehab Potential  Good    PT Frequency 2x / week    PT Duration 8 weeks    PT Treatment/Interventions ADLs/Self Care Home Management;Aquatic Therapy;Gait training;Stair training;Functional mobility training;Therapeutic activities;Therapeutic exercise;Balance training;Neuromuscular re-education;Patient/family education    PT Next Visit Plan contnue balance training to improve cadence and confidence, CW/CCW patterns with ball in tandem stance, challenge gait in // bars with marching/hurdles    PT Home Exercise Plan [id number]    Consulted and Agree with Plan of Care Patient             Patient will benefit from skilled therapeutic intervention in order to improve the following deficits and impairments:  Abnormal gait, Difficulty walking, Decreased endurance, Decreased activity tolerance, Decreased balance, Decreased mobility, Decreased strength  Visit Diagnosis: Unsteadiness on feet  Other abnormalities of gait and mobility  Hemiplegia and hemiparesis following cerebral infarction affecting left non-dominant side [facility])     Problem List Patient Active Problem List   Diagnosis Date Noted   Stroke (HCC) 04/27/2020   Elevated blood-pressure reading without diagnosis of hypertension 04/27/2020    Hildred Laser PT 07/03/2020, 11:49 AM  [facility] [address] [place], [place], [address] Phone: [phone]   Fax:  [phone]  Name: Randy Robertson MRN: [id number] Date of Birth: 26-Jun-1950

## 2020-07-04 ENCOUNTER — Telehealth: Payer: Self-pay | Admitting: *Deleted

## 2020-07-04 NOTE — Telephone Encounter (Signed)
[employer] Disability form completed, to JM/NP for review, completion and signature.

## 2020-07-05 ENCOUNTER — Other Ambulatory Visit: Payer: Self-pay

## 2020-07-05 ENCOUNTER — Ambulatory Visit: Payer: Medicare HMO

## 2020-07-05 DIAGNOSIS — R2689 Other abnormalities of gait and mobility: Secondary | ICD-10-CM | POA: Diagnosis not present

## 2020-07-05 DIAGNOSIS — R2681 Unsteadiness on feet: Secondary | ICD-10-CM | POA: Diagnosis not present

## 2020-07-05 DIAGNOSIS — I69354 Hemiplegia and hemiparesis following cerebral infarction affecting left non-dominant side: Secondary | ICD-10-CM

## 2020-07-05 NOTE — Therapy (Signed)
Randy Robertson [address] [place], [place], [address] Phone: [phone]   Fax:  [phone]  Physical Therapy Treatment  Patient Details  Name: Randy Robertson MRN: [id number] Date of Birth: 03-Dec-1950 Referring Provider (PT): Randy Beets MD   Encounter Date: 07/05/2020   PT End of Session - 07/05/20 1008     Visit Number 15    Number of Visits 16    Date for PT Re-Evaluation 07/03/20    Authorization Type [employer]    Progress Note Due on Visit 10    PT Start Time 1015    PT Stop Time 0100    PT Time Calculation (min) 885 min    Equipment Utilized During Treatment Gait belt    Activity Tolerance Patient tolerated treatment well    Behavior During Therapy [facility] for tasks assessed/performed             Past Medical History:  Diagnosis Date   Stroke (cerebrum) [facility])     Past Surgical History:  Procedure Laterality Date   implantable loop recorder placement  06/22/2020   Medtronic Reveal Linq model [id number] implantable loop recorder  (SN [id number])  for cryptogenic stroke   NO PAST SURGERIES      There were no vitals filed for this visit.       [facility] PT Assessment - 07/05/20 0001       Timed Up and Go Test   Normal TUG (seconds) 11.7                           [facility] Adult PT Treatment/Exercise - 07/05/20 0001       Transfers   Five time sit to stand comments  16.9s      Ambulation/Gait   Ambulation/Gait Yes    Ambulation/Gait Assistance 6: Modified independent (Device/Increase time)    Ambulation Distance (Feet) 1200 Feet    Assistive device None    Gait Pattern Step-through pattern    Ambulation Surface Level;Unlevel;Indoor;Outdoor;Grass    Door Management 7: Independent    Ramp 7: Independent    Gait Comments 1228f across level ground, slopes, curbs, performed fig 8s, reaching OH from upward and downward slopes w/o any apprehnsion or LOB      Lumbar Exercises:  Seated   Other Seated Lumbar Exercises core exercises of seated hip tosses, shoulder tosses, chops, Victories, 15 reps each with 5.5#  ball      Knee/Hip Exercises: Aerobic   Other Aerobic Scifit L1 8' arms 6                 Balance Exercises - 07/05/20 0001       Balance Exercises: Standing   Other Standing Exercises Comments Tandem stance with OH ball lift, circles with 5.5# 5x in ea. posiiton, CW/CCW                 PT Short Term Goals - 06/19/20 1028       PT SHORT TERM GOAL #1   Title patient to demo initial HEP back to PT    Baseline 05/23/20 began 5x STS BID; 06/19/20 Patient able to demo HEP back w/o need of cuing    Time 4    Period Weeks    Status Achieved    Target Date 06/19/20      PT SHORT TERM GOAL #2   Title Assess BERG and establish appropriate goal    Baseline 05/23/20 BERG score  43, goal is 51    Time 4    Period Weeks    Status Achieved    Target Date 06/19/20      PT SHORT TERM GOAL #3   Title Assess FGA/DGI and set appropriate goal    Baseline FGA patially completed due to time constraints; 05/24/20 FGA score 13, goal is 21    Time 5    Period Weeks    Status Achieved    Target Date 06/19/20      PT SHORT TERM GOAL #4   Title Patient to ambulate 558f w/o need of AD with SBA across outdoor surfaces    Baseline 1052fin clinic under SBA w/o need of AD; 06/19/20 Ambulation of 50017futside w/o ned of AD under S    Time 5    Period Weeks    Status Achieved    Target Date 06/19/20      PT SHORT TERM GOAL #5   Title Patient to negotiate full flight of stairs with most appropriate pattern and support    Baseline 4 steps with single rail and step to pattern; 06/19/20 Able to negotiate 16steps with B rails and step through pattern under S    Time 5    Period Weeks    Status Achieved    Target Date 06/19/20               PT Long Term Goals - 07/05/20 1044       PT LONG TERM GOAL #1   Title patient to demo final HEP back to PT     Baseline TBD; 07/03/20 HEP reviewed verbally    Time 9    Period Weeks    Status On-going      PT LONG TERM GOAL #2   Title Patient to increase FOTO score to 65    Baseline Initial FOTO score 54    Time 9    Period Weeks    Status New      PT LONG TERM GOAL #3   Title Patient to demo gait velocity of 0.8 m/s w/o AD    Baseline 0.55 m/s w/o AD; 06/19/20 gait velocity 0.59 m/s; 07/05/20 gait velocity 0.73 m/s    Time 9    Period Weeks    Status On-going      PT LONG TERM GOAL #4   Title Patient to ambulate 1000f61fross outdoor surfaces w/o AD under S    Baseline 100ft80foss leel indoor surface w/o AD under SBA; 07/05/20 1200ft 33fAD under S across all outdoor surfaces    Time 9    Period Weeks    Status Achieved      PT LONG TERM GOAL #5   Title Patient to perform TUG w/o AD in 13s; 06/19/20 TUG w/o AD 15.4s; 07/05/20 TUG score 11.7s    Baseline Initial TUG w/o AD 19.02s    Time 9    Period Weeks    Status Achieved      PT LONG TERM GOAL #6   Title patient to perform 5x STS test w/o UE support in 15s    Baseline 5x STS score w/o UE assist 22.16s; 06/19/20 5x STS with UE support 21.1s; 07/05/20 5x STS 16.9s    Time 9    Period Weeks    Status On-going                   Plan - 07/05/20 1050     Clinical Impression  Statement Todays session focused on LTG progress due to pending re-assessment at end of visit number.  Patient has met ambulation goal, TUG goal has improved 5xSTS tie and gait velocity  but goals not yet met.  Continu 1 more visit next week with plan to continue 1w4    Personal Factors and Comorbidities Comorbidity 1    Comorbidities CVA    Examination-Activity Limitations Locomotion Level;Stairs    Examination-Participation Restrictions Occupation    Stability/Clinical Decision Making Stable/Uncomplicated    Rehab Potential Good    PT Frequency 2x / week    PT Duration 8 weeks    PT Treatment/Interventions ADLs/Self Care Home Management;Aquatic  Therapy;Gait training;Stair training;Functional mobility training;Therapeutic activities;Therapeutic exercise;Balance training;Neuromuscular re-education;Patient/family education    PT Next Visit Plan CW/CCW patterns with ball in tandem stance, challenge gait in // bars with marching/hurdles, re-cert for 4 more weeks    PT Home Exercise Plan [email or web address]    Consulted and Agree with Plan of Care Patient             Patient will benefit from skilled therapeutic intervention in order to improve the following deficits and impairments:  Abnormal gait, Difficulty walking, Decreased endurance, Decreased activity tolerance, Decreased balance, Decreased mobility, Decreased strength  Visit Diagnosis: Unsteadiness on feet  Other abnormalities of gait and mobility  Hemiplegia and hemiparesis following cerebral infarction affecting left non-dominant side [facility])     Problem List Patient Active Problem List   Diagnosis Date Noted   Stroke ([facility]) 04/27/2020   Elevated blood-pressure reading without diagnosis of hypertension 04/27/2020    Lanice Shirts PT 07/05/2020, 11:01 AM  [facility] [address] [place], [place], [address] Phone: [phone]   Fax:  [phone]  Name: Randy Robertson MRN: [id number] Date of Birth: 1950-08-18

## 2020-07-06 NOTE — Progress Notes (Signed)
I agree with the above plan 

## 2020-07-10 ENCOUNTER — Other Ambulatory Visit: Payer: Self-pay

## 2020-07-10 ENCOUNTER — Telehealth: Payer: Self-pay | Admitting: Adult Health

## 2020-07-10 ENCOUNTER — Ambulatory Visit: Payer: Medicare HMO | Attending: Internal Medicine

## 2020-07-10 DIAGNOSIS — I69354 Hemiplegia and hemiparesis following cerebral infarction affecting left non-dominant side: Secondary | ICD-10-CM | POA: Diagnosis not present

## 2020-07-10 DIAGNOSIS — R2681 Unsteadiness on feet: Secondary | ICD-10-CM | POA: Insufficient documentation

## 2020-07-10 DIAGNOSIS — R2689 Other abnormalities of gait and mobility: Secondary | ICD-10-CM | POA: Insufficient documentation

## 2020-07-10 MED ORDER — AMLODIPINE BESYLATE 2.5 MG PO TABS: 2.5000 mg | ORAL_TABLET | Freq: Every day | ORAL | 5 refills | Status: DC

## 2020-07-10 NOTE — Therapy (Signed)
[facility] [address] [place], [place], [address] Phone: [phone]   Fax:  [phone]  Physical Therapy Treatment  Patient Details  Name: Randy Robertson MRN: [id number] Date of Birth: 02-15-1950 Referring Provider (PT): Fredia Beets MD   Encounter Date: 07/10/2020   PT End of Session - 07/10/20 1013     Visit Number 16    Number of Visits 17    Date for PT Re-Evaluation 07/03/20    Authorization Type [employer]    Progress Note Due on Visit 10    PT Start Time 1015    PT Stop Time 1100    PT Time Calculation (min) 45 min    Equipment Utilized During Treatment Gait belt    Activity Tolerance Patient tolerated treatment well             Past Medical History:  Diagnosis Date   Stroke (cerebrum) [facility])     Past Surgical History:  Procedure Laterality Date   implantable loop recorder placement  06/22/2020   Medtronic Reveal Linq model [id number] implantable loop recorder  (SN [id number])  for cryptogenic stroke   NO PAST SURGERIES      There were no vitals filed for this visit.   Subjective Assessment - 07/10/20 1017     Subjective Doing well, went to [facility] with his freind over the weekend.    Patient is accompained by: Interpreter    Pertinent History Randy Robertson is a 70 y.o. male with no significant past medical history who presents 4/21 from [facility] [place] [facility]  ER for stroke work-up due to left arm weakness and unsteady gait.  WSF:KCLEXNTZGY acute ischemia within the right MCA territory.    Limitations Walking    Patient Stated Goals To walk better                [facility] PT Assessment - 07/10/20 0001       Functional Gait  Assessment   Gait assessed  Yes    Gait Level Surface Walks 20 ft, slow speed, abnormal gait pattern, evidence for imbalance or deviates 10-15 in outside of the 12 in walkway width. Requires more than 7 sec to ambulate 20 ft.    Change in Gait Speed Able to change  speed, demonstrates mild gait deviations, deviates 6-10 in outside of the 12 in walkway width, or no gait deviations, unable to achieve a major change in velocity, or uses a change in velocity, or uses an assistive device.    Gait with Horizontal Head Turns Performs head turns smoothly with slight change in gait velocity (eg, minor disruption to smooth gait path), deviates 6-10 in outside 12 in walkway width, or uses an assistive device.    Gait with Vertical Head Turns Performs task with slight change in gait velocity (eg, minor disruption to smooth gait path), deviates 6 - 10 in outside 12 in walkway width or uses assistive device    Gait and Pivot Turn Pivot turns safely in greater than 3 sec and stops with no loss of balance, or pivot turns safely within 3 sec and stops with mild imbalance, requires small steps to catch balance.    Step Over Obstacle Is able to step over one shoe box (4.5 in total height) without changing gait speed. No evidence of imbalance.    Gait with Narrow Base of Support Ambulates 7-9 steps.    Gait with Eyes Closed Walks 20 ft, slow speed, abnormal gait pattern, evidence for imbalance, deviates  10-15 in outside 12 in walkway width. Requires more than 9 sec to ambulate 20 ft.    Ambulating Backwards Walks 20 ft, uses assistive device, slower speed, mild gait deviations, deviates 6-10 in outside 12 in walkway width.    Steps Alternating feet, must use rail.    Total Score 18             Practiced gait with head turns for HEP              [facility] Adult PT Treatment/Exercise - 07/10/20 0001       Knee/Hip Exercises: Aerobic   Other Aerobic Scifit L3 arms 8 8'                      PT Short Term Goals - 06/19/20 1028       PT SHORT TERM GOAL #1   Title patient to demo initial HEP back to PT    Baseline 05/23/20 began 5x STS BID; 06/19/20 Patient able to demo HEP back w/o need of cuing    Time 4    Period Weeks    Status Achieved    Target  Date 06/19/20      PT SHORT TERM GOAL #2   Title Assess BERG and establish appropriate goal    Baseline 05/23/20 BERG score 43, goal is 51    Time 4    Period Weeks    Status Achieved    Target Date 06/19/20      PT SHORT TERM GOAL #3   Title Assess FGA/DGI and set appropriate goal    Baseline FGA patially completed due to time constraints; 05/24/20 FGA score 13, goal is 21    Time 5    Period Weeks    Status Achieved    Target Date 06/19/20      PT SHORT TERM GOAL #4   Title Patient to ambulate 566f w/o need of AD with SBA across outdoor surfaces    Baseline 1070fin clinic under SBA w/o need of AD; 06/19/20 Ambulation of 50011futside w/o ned of AD under S    Time 5    Period Weeks    Status Achieved    Target Date 06/19/20      PT SHORT TERM GOAL #5   Title Patient to negotiate full flight of stairs with most appropriate pattern and support    Baseline 4 steps with single rail and step to pattern; 06/19/20 Able to negotiate 16steps with B rails and step through pattern under S    Time 5    Period Weeks    Status Achieved    Target Date 06/19/20               PT Long Term Goals - 07/10/20 1102       PT LONG TERM GOAL #1   Title patient to demo final HEP back to PT    Baseline TBD; 07/03/20 HEP reviewed verbally    Time 9    Period Weeks    Status On-going      PT LONG TERM GOAL #2   Title Patient to increase FOTO score to 65    Baseline Initial FOTO score 54    Time 9    Period Weeks    Status New      PT LONG TERM GOAL #3   Title Patient to demo gait velocity of 0.8 m/s w/o AD    Baseline 0.55 m/s w/o AD; 06/19/20  gait velocity 0.59 m/s; 07/05/20 gait velocity 0.73 m/s    Time 9    Period Weeks    Status On-going      PT LONG TERM GOAL #4   Title Patient to ambulate 1059f across outdoor surfaces w/o AD under S    Baseline 1072facross leel indoor surface w/o AD under SBA; 07/05/20 12001f/o AD under S across all outdoor surfaces    Time 9    Period  Weeks    Status Achieved      PT LONG TERM GOAL #5   Title Patient to perform TUG w/o AD in 13s; 06/19/20 TUG w/o AD 15.4s; 07/05/20 TUG score 11.7s    Baseline Initial TUG w/o AD 19.02s    Time 9    Period Weeks    Status Achieved      PT LONG TERM GOAL #6   Title patient to perform 5x STS test w/o UE support in 15s    Baseline 5x STS score w/o UE assist 22.16s; 06/19/20 5x STS with UE support 21.1s; 07/05/20 5x STS 16.9s    Time 9    Period Weeks    Status On-going                   Plan - 07/10/20 1057     Clinical Impression Statement Todays session consisted of re-assessment of FGA and update to HEP based on deficits.  Patient demos LOB with head turns and narrow BOS tasks and continues to demo a slow cadence and some apprehension with balance challenges. FGA ascore improved but deficits still noted.  STGs met, patient progressing towards LTGs    Personal Factors and Comorbidities Comorbidity 1    Comorbidities CVA    Examination-Activity Limitations Locomotion Level;Stairs    Examination-Participation Restrictions Occupation    Stability/Clinical Decision Making Stable/Uncomplicated    Rehab Potential Good    PT Frequency 2x / week    PT Duration 8 weeks    PT Treatment/Interventions ADLs/Self Care Home Management;Aquatic Therapy;Gait training;Stair training;Functional mobility training;Therapeutic activities;Therapeutic exercise;Balance training;Neuromuscular re-education;Patient/family education    PT Next Visit Plan CW/CCW patterns with ball in tandem stance, challenge gait in // bars with marching/hurdles, re-cert for 4 more weeks, EC tasks    PT Home Exercise Plan [email or web address]    Consulted and Agree with Plan of Care Patient             Patient will benefit from skilled therapeutic intervention in order to improve the following deficits and impairments:  Abnormal gait, Difficulty walking, Decreased endurance, Decreased activity tolerance, Decreased balance,  Decreased mobility, Decreased strength  Visit Diagnosis: Unsteadiness on feet  Hemiplegia and hemiparesis following cerebral infarction affecting left non-dominant side (HCC)  Other abnormalities of gait and mobility     Problem List Patient Active Problem List   Diagnosis Date Noted   Stroke ([place]4/22/2022   Elevated blood-pressure reading without diagnosis of hypertension 04/27/2020    JefLanice Shirts5/2022, 11:18 AM  [place]one: [phone]:  [phone]: StoZarion OliffN: [id number] of Birth: [birth year]

## 2020-07-10 NOTE — Telephone Encounter (Signed)
Pt is needing a refill on his amLODipine (NORVASC) 2.5 MG tablet sent in to the [facility] on [address].

## 2020-07-10 NOTE — Telephone Encounter (Signed)
Completed and placed in out box.  Thank you. 

## 2020-07-10 NOTE — Patient Instructions (Signed)
Access Code: [email or web address] URL: [email or web address] Date: 07/10/2020 Prepared by: Gustavus Bryant  Exercises Side Stepping with Counter Support - 2 x daily - 7 x weekly - 2 sets - 10 reps Backward Walking with Counter Support - 2 x daily - 7 x weekly - 2 sets - 10 reps Tandem Walking with Counter Support - 2 x daily - 7 x weekly - 2 sets - 10 reps Tandem Stance with Eyes Closed and Head Rotation - 2 x daily - 7 x weekly - 2 sets - 5 reps - 1 hold Walking with Head Rotation - 2 x daily - 7 x weekly - 2 sets - 10 reps

## 2020-07-10 NOTE — Telephone Encounter (Signed)
Contacted pharmacy, we sent original Rx in 06/26/20, pharmacy did not receive it. Resent new rx, conformed new receipt with pharmacy.

## 2020-07-11 NOTE — Telephone Encounter (Signed)
[employer] form completed, faxed to [phone]. Received confirmation.

## 2020-07-12 ENCOUNTER — Other Ambulatory Visit: Payer: Self-pay

## 2020-07-12 ENCOUNTER — Ambulatory Visit: Payer: Medicare HMO

## 2020-07-12 DIAGNOSIS — I69354 Hemiplegia and hemiparesis following cerebral infarction affecting left non-dominant side: Secondary | ICD-10-CM | POA: Diagnosis not present

## 2020-07-12 DIAGNOSIS — R2689 Other abnormalities of gait and mobility: Secondary | ICD-10-CM | POA: Diagnosis not present

## 2020-07-12 DIAGNOSIS — R2681 Unsteadiness on feet: Secondary | ICD-10-CM

## 2020-07-12 NOTE — Therapy (Signed)
[facility] [address] [place], [place], [address] Phone: [phone]   Fax:  [phone]  Physical Therapy Treatment  Patient Details  Name: Randy Robertson MRN: [id number] Date of Birth: 04-03-1950 Referring Provider (PT): Otilio Carpen MD   Encounter Date: 07/12/2020   PT End of Session - 07/12/20 1013     Visit Number 17    Number of Visits 25    Date for PT Re-Evaluation 07/03/20    Authorization Type [employer]    Progress Note Due on Visit 10    PT Start Time 1015    PT Stop Time 1100    PT Time Calculation (min) 45 min    Equipment Utilized During Treatment Gait belt    Activity Tolerance Patient tolerated treatment well             Past Medical History:  Diagnosis Date   Stroke (cerebrum) [facility])     Past Surgical History:  Procedure Laterality Date   implantable loop recorder placement  06/22/2020   Medtronic Reveal Linq model [id number] implantable loop recorder  (SN [id number])  for cryptogenic stroke   NO PAST SURGERIES      There were no vitals filed for this visit.   Subjective Assessment - 07/12/20 1020     Subjective Reports some dizziness last night when walking, had to have wifehelp him home, better today but not 100%, discussed need to hydrate more when walking in the heat.    Patient is accompained by: Interpreter    Pertinent History Randy Robertson is a 70 y.o. male with no significant past medical history who presents 4/21 from [facility]  ER for stroke work-up due to left arm weakness and unsteady gait.  ASN:KNLZJQBHAL acute ischemia within the right MCA territory.    Limitations Walking    Patient Stated Goals To walk better                               [facility] Adult PT Treatment/Exercise - 07/12/20 0001       Lumbar Exercises: Seated   Other Seated Lumbar Exercises core exercises of seated hip tosses, shoulder tosses, chops, Victories, 15 reps each with  5.5#  ball, sitting on blue balance disk    Other Seated Lumbar Exercises Latissimus press with foam roll seated on balance disk, 15 reps      Knee/Hip Exercises: Aerobic   Other Aerobic Scifit L3 arms 8 8'      Knee/Hip Exercises: Seated   Long Arc Quad 15 reps;Both    Long Arc Quad Weight 2 lbs.    Long [employer] Limitations performed with latissimus press and 2# wts on blue balance disk    Marching Strengthening;Both;1 set;15 reps;Limitations    Marching Limitations Performe with latissimus press and 2# wt    Marching Weights 2 lbs.                 Balance Exercises - 07/12/20 0001       Balance Exercises: Standing   Other Standing Exercises standing with foam roll support performing opposite UE/LE abd, 15x, 2# at ankles                 PT Short Term Goals - 06/19/20 1028       PT SHORT TERM GOAL #1   Title patient to demo initial HEP back to PT    Baseline 05/23/20 began 5x STS  BID; 06/19/20 Patient able to demo HEP back w/o need of cuing    Time 4    Period Weeks    Status Achieved    Target Date 06/19/20      PT SHORT TERM GOAL #2   Title Assess BERG and establish appropriate goal    Baseline 05/23/20 BERG score 43, goal is 51    Time 4    Period Weeks    Status Achieved    Target Date 06/19/20      PT SHORT TERM GOAL #3   Title Assess FGA/DGI and set appropriate goal    Baseline FGA patially completed due to time constraints; 05/24/20 FGA score 13, goal is 21    Time 5    Period Weeks    Status Achieved    Target Date 06/19/20      PT SHORT TERM GOAL #4   Title Patient to ambulate 531ft w/o need of AD with SBA across outdoor surfaces    Baseline 142ft in clinic under SBA w/o need of AD; 06/19/20 Ambulation of 545ft outside w/o ned of AD under S    Time 5    Period Weeks    Status Achieved    Target Date 06/19/20      PT SHORT TERM GOAL #5   Title Patient to negotiate full flight of stairs with most appropriate pattern and support    Baseline  4 steps with single rail and step to pattern; 06/19/20 Able to negotiate 16steps with B rails and step through pattern under S    Time 5    Period Weeks    Status Achieved    Target Date 06/19/20               PT Long Term Goals - 07/10/20 1102       PT LONG TERM GOAL #1   Title patient to demo final HEP back to PT    Baseline TBD; 07/03/20 HEP reviewed verbally    Time 9    Period Weeks    Status On-going      PT LONG TERM GOAL #2   Title Patient to increase FOTO score to 65    Baseline Initial FOTO score 54    Time 9    Period Weeks    Status New      PT LONG TERM GOAL #3   Title Patient to demo gait velocity of 0.8 m/s w/o AD    Baseline 0.55 m/s w/o AD; 06/19/20 gait velocity 0.59 m/s; 07/05/20 gait velocity 0.73 m/s    Time 9    Period Weeks    Status On-going      PT LONG TERM GOAL #4   Title Patient to ambulate 1085ft across outdoor surfaces w/o AD under S    Baseline 159ft across leel indoor surface w/o AD under SBA; 07/05/20 1240ft w/o AD under S across all outdoor surfaces    Time 9    Period Weeks    Status Achieved      PT LONG TERM GOAL #5   Title Patient to perform TUG w/o AD in 13s; 06/19/20 TUG w/o AD 15.4s; 07/05/20 TUG score 11.7s    Baseline Initial TUG w/o AD 19.02s    Time 9    Period Weeks    Status Achieved      PT LONG TERM GOAL #6   Title patient to perform 5x STS test w/o UE support in 15s    Baseline 5x STS  score w/o UE assist 22.16s; 06/19/20 5x STS with UE support 21.1s; 07/05/20 5x STS 16.9s    Time 9    Period Weeks    Status On-going                   Plan - 07/12/20 1059     Personal Factors and Comorbidities Comorbidity 1    Examination-Activity Limitations Locomotion Level;Stairs    Examination-Participation Restrictions Occupation    Stability/Clinical Decision Making Stable/Uncomplicated    Rehab Potential Good    PT Frequency 2x / week    PT Duration 8 weeks    PT Treatment/Interventions ADLs/Self Care Home  Management;Aquatic Therapy;Gait training;Stair training;Functional mobility training;Therapeutic activities;Therapeutic exercise;Balance training;Neuromuscular re-education;Patient/family education    PT Next Visit Plan assess dizziness and need for further assesment, continue balance and gluteus mediu strengthening             Patient will benefit from skilled therapeutic intervention in order to improve the following deficits and impairments:  Abnormal gait, Difficulty walking, Decreased endurance, Decreased activity tolerance, Decreased balance, Decreased mobility, Decreased strength  Visit Diagnosis: Hemiplegia and hemiparesis following cerebral infarction affecting left non-dominant side (HCC)  Unsteadiness on feet  Other abnormalities of gait and mobility     Problem List Patient Active Problem List   Diagnosis Date Noted   Stroke (HCC) 04/27/2020   Elevated blood-pressure reading without diagnosis of hypertension 04/27/2020    Hildred Laser PT 07/12/2020, 11:03 AM  [facility] [address] [place], [place], [address] Phone: [phone]   Fax:  [phone]  Name: Randy Robertson MRN: [id number] Date of Birth: 06-Dec-1950

## 2020-07-16 ENCOUNTER — Other Ambulatory Visit: Payer: Self-pay

## 2020-07-16 ENCOUNTER — Telehealth: Payer: Self-pay | Admitting: *Deleted

## 2020-07-16 ENCOUNTER — Ambulatory Visit: Payer: Medicare HMO

## 2020-07-16 DIAGNOSIS — R2681 Unsteadiness on feet: Secondary | ICD-10-CM

## 2020-07-16 DIAGNOSIS — R2689 Other abnormalities of gait and mobility: Secondary | ICD-10-CM

## 2020-07-16 DIAGNOSIS — I69354 Hemiplegia and hemiparesis following cerebral infarction affecting left non-dominant side: Secondary | ICD-10-CM

## 2020-07-16 NOTE — Telephone Encounter (Signed)
I re faxed pt [place] form and last office note on 07/16/20.

## 2020-07-16 NOTE — Therapy (Signed)
[facility] [address] [place], [place], [address] Phone: [phone]   Fax:  [phone]  Physical Therapy Treatment  Patient Details  Name: Randy Robertson MRN: [id number] Date of Birth: [birth year] Referring Provider (PT): Otilio Carpen MD   Encounter Date: 07/16/2020   PT End of Session - 07/16/20 1706     Visit Number 18    Number of Visits 25    Authorization Type [employer]    Progress Note Due on Visit 10    PT Start Time 1700    PT Stop Time 1745    PT Time Calculation (min) 45 min    Equipment Utilized During Treatment Gait belt    Activity Tolerance Patient tolerated treatment well    Behavior During Therapy [facility] for tasks assessed/performed             Past Medical History:  Diagnosis Date   Stroke (cerebrum) [facility])     Past Surgical History:  Procedure Laterality Date   implantable loop recorder placement  06/22/2020   Medtronic Reveal Linq model [id number] implantable loop recorder  (SN [id number])  for cryptogenic stroke   NO PAST SURGERIES      There were no vitals filed for this visit.   Subjective Assessment - 07/16/20 1621     Subjective No falls or changes to note.  Has not felt dizzy but was not taking BP medication regularly.    Patient is accompained by: Interpreter    Pertinent History Randy Robertson is a 70 y.o. male with no significant past medical history who presents 4/21 from [facility]  ER for stroke work-up due to left arm weakness and unsteady gait.  SEG:[id number] acute ischemia within the right MCA territory.    Limitations Walking    Patient Stated Goals To walk better    Currently in Pain? No/denies                               [facility] Adult PT Treatment/Exercise - 07/16/20 0001       Ambulation/Gait   Ambulation/Gait Yes    Ambulation/Gait Assistance 5: Supervision    Ambulation Distance (Feet) 115 Feet    Assistive device None    Gait  Pattern Step-through pattern    Ambulation Surface Level;Indoor    Gait Comments performed ball pass from each shoulder while ambulating      Knee/Hip Exercises: Aerobic   Other Aerobic Scifit L3 arms 8 8'                 Balance Exercises - 07/16/20 0001       Balance Exercises: Standing   Rockerboard Anterior/posterior;Limitations    Rockerboard Limitations self ball catch for 2 min followed by over the shoulder ball pass, random x    Tandem Gait Forward;Intermittent upper extremity support;5 reps;Limitations    Tandem Gait Limitations 5 trips in // barsacross blue mat    Retro Gait Foam/compliant surface;5 reps;Limitations    Retro Gait Limitations intermittent UE support across blue mat in // bars    Other Standing Exercises Satnding on Airex catching ball for 2 min duration                 PT Short Term Goals - 06/19/20 1028       PT SHORT TERM GOAL #1   Title patient to demo initial HEP back to PT    Baseline 05/23/20 began  5x STS BID; 06/19/20 Patient able to demo HEP back w/o need of cuing    Time 4    Period Weeks    Status Achieved    Target Date 06/19/20      PT SHORT TERM GOAL #2   Title Assess BERG and establish appropriate goal    Baseline 05/23/20 BERG score 43, goal is 51    Time 4    Period Weeks    Status Achieved    Target Date 06/19/20      PT SHORT TERM GOAL #3   Title Assess FGA/DGI and set appropriate goal    Baseline FGA patially completed due to time constraints; 05/24/20 FGA score 13, goal is 21    Time 5    Period Weeks    Status Achieved    Target Date 06/19/20      PT SHORT TERM GOAL #4   Title Patient to ambulate 594ft w/o need of AD with SBA across outdoor surfaces    Baseline 158ft in clinic under SBA w/o need of AD; 06/19/20 Ambulation of 555ft outside w/o ned of AD under S    Time 5    Period Weeks    Status Achieved    Target Date 06/19/20      PT SHORT TERM GOAL #5   Title Patient to negotiate full flight of  stairs with most appropriate pattern and support    Baseline 4 steps with single rail and step to pattern; 06/19/20 Able to negotiate 16steps with B rails and step through pattern under S    Time 5    Period Weeks    Status Achieved    Target Date 06/19/20               PT Long Term Goals - 07/10/20 1102       PT LONG TERM GOAL #1   Title patient to demo final HEP back to PT    Baseline TBD; 07/03/20 HEP reviewed verbally    Time 9    Period Weeks    Status On-going      PT LONG TERM GOAL #2   Title Patient to increase FOTO score to 65    Baseline Initial FOTO score 54    Time 9    Period Weeks    Status New      PT LONG TERM GOAL #3   Title Patient to demo gait velocity of 0.8 m/s w/o AD    Baseline 0.55 m/s w/o AD; 06/19/20 gait velocity 0.59 m/s; 07/05/20 gait velocity 0.73 m/s    Time 9    Period Weeks    Status On-going      PT LONG TERM GOAL #4   Title Patient to ambulate 1042ft across outdoor surfaces w/o AD under S    Baseline 185ft across leel indoor surface w/o AD under SBA; 07/05/20 1274ft w/o AD under S across all outdoor surfaces    Time 9    Period Weeks    Status Achieved      PT LONG TERM GOAL #5   Title Patient to perform TUG w/o AD in 13s; 06/19/20 TUG w/o AD 15.4s; 07/05/20 TUG score 11.7s    Baseline Initial TUG w/o AD 19.02s    Time 9    Period Weeks    Status Achieved      PT LONG TERM GOAL #6   Title patient to perform 5x STS test w/o UE support in 15s    Baseline  5x STS score w/o UE assist 22.16s; 06/19/20 5x STS with UE support 21.1s; 07/05/20 5x STS 16.9s    Time 9    Period Weeks    Status On-going                   Plan - 07/16/20 1713     Clinical Impression Statement Todays session focused on static and dynamic balance tasks, narrow BOS with distractions and and incorporated UE tasks.  Able to completes tasks in // bars w/o UE support.  Noticed increased cadence with ambulation and improved confidence as evidenced by  increased step length.  Continues to demo difficulty with tasks directed to L side.    Personal Factors and Comorbidities Comorbidity 1    Examination-Activity Limitations Locomotion Level;Stairs    Examination-Participation Restrictions Occupation    Stability/Clinical Decision Making Stable/Uncomplicated    Rehab Potential Good    PT Frequency 2x / week    PT Duration 8 weeks    PT Treatment/Interventions ADLs/Self Care Home Management;Aquatic Therapy;Gait training;Stair training;Functional mobility training;Therapeutic activities;Therapeutic exercise;Balance training;Neuromuscular re-education;Patient/family education    PT Next Visit Plan continue wot balance tasks, uneven surfaces , narrow BOS and incorporate UE tasks to offer balance challengesand distractions    PT Home Exercise Plan [id number]    Consulted and Agree with Plan of Care Patient             Patient will benefit from skilled therapeutic intervention in order to improve the following deficits and impairments:  Abnormal gait, Difficulty walking, Decreased endurance, Decreased activity tolerance, Decreased balance, Decreased mobility, Decreased strength  Visit Diagnosis: Hemiplegia and hemiparesis following cerebral infarction affecting left non-dominant side (HCC)  Unsteadiness on feet  Other abnormalities of gait and mobility     Problem List Patient Active Problem List   Diagnosis Date Noted   Stroke (HCC) 04/27/2020   Elevated blood-pressure reading without diagnosis of hypertension 04/27/2020    Hildred Laser 07/16/2020, 5:19 PM  [facility] [address] [place], [place], [address] Phone: [phone]   Fax:  [phone]  Name: Randy Robertson MRN: [id number] Date of Birth: 1950/03/09

## 2020-07-19 ENCOUNTER — Other Ambulatory Visit: Payer: Self-pay

## 2020-07-19 ENCOUNTER — Ambulatory Visit: Payer: Medicare HMO

## 2020-07-19 VITALS — BP 158/87

## 2020-07-19 DIAGNOSIS — R2681 Unsteadiness on feet: Secondary | ICD-10-CM | POA: Diagnosis not present

## 2020-07-19 DIAGNOSIS — R2689 Other abnormalities of gait and mobility: Secondary | ICD-10-CM

## 2020-07-19 DIAGNOSIS — I69354 Hemiplegia and hemiparesis following cerebral infarction affecting left non-dominant side: Secondary | ICD-10-CM

## 2020-07-19 NOTE — Therapy (Signed)
[facility] [address] [place], [place], [address] Phone: [phone]   Fax:  [phone]  Physical Therapy Treatment  Patient Details  Name: Randy Robertson MRN: [id number] Date of Birth: March 20, 1950 Referring Provider (PT): Otilio Carpen MD   Encounter Date: 07/19/2020   PT End of Session - 07/19/20 1026     Visit Number 19    Number of Visits 25    Date for PT Re-Evaluation 08/09/20    Authorization Type [employer]    Progress Note Due on Visit 10    PT Start Time 1015    PT Stop Time 1100    PT Time Calculation (min) 45 min    Equipment Utilized During Treatment Gait belt    Activity Tolerance Patient tolerated treatment well    Behavior During Therapy [facility] for tasks assessed/performed             Past Medical History:  Diagnosis Date   Stroke (cerebrum) [facility])     Past Surgical History:  Procedure Laterality Date   implantable loop recorder placement  06/22/2020   Medtronic Reveal Linq model [id number] implantable loop recorder  (SN [id number])  for cryptogenic stroke   NO PAST SURGERIES      Vitals:   07/19/20 1027  BP: (!) 158/87     Subjective Assessment - 07/19/20 1020     Subjective Continues to report improvement, notes increased cadence and stability when walking    Patient is accompained by: Interpreter    Pertinent History Randy Robertson is a 70 y.o. male with no significant past medical history who presents 4/21 from [facility]  ER for stroke work-up due to left arm weakness and unsteady gait.  WNU:[id number] acute ischemia within the right MCA territory.    Limitations Walking    Patient Stated Goals To walk better                               [facility] Adult PT Treatment/Exercise - 07/19/20 0001       Lumbar Exercises: Seated   Other Seated Lumbar Exercises core exercises of seated hip tosses, shoulder tosses, chops, Victories, 10 reps each with 6#  ball,  sitting on green balance disk      Knee/Hip Exercises: Aerobic   Other Aerobic Scifit L3.5 arms 8 8'      Knee/Hip Exercises: Seated   Long Arc Quad Strengthening;Both;1 set;15 reps;Limitations;Weights    Long Arc Quad Weight 2 lbs.    Long [employer] Limitations performed with latissimus press and 2# wts on green balance disk with alt pattern    Marching Strengthening;Both;1 set;15 reps;Limitations    Marching Limitations Performed with latissimus press and 2# wt from green balance disk in alternating pattern    Marching Weights 2 lbs.                 Balance Exercises - 07/19/20 0001       Balance Exercises: Standing   Rockerboard Anterior/posterior;Lateral    Rockerboard Limitations self ball catch for 2 min in ea. direction, S provided                 PT Short Term Goals - 06/19/20 1028       PT SHORT TERM GOAL #1   Title patient to demo initial HEP back to PT    Baseline 05/23/20 began 5x STS BID; 06/19/20 Patient able to demo HEP back w/o  need of cuing    Time 4    Period Weeks    Status Achieved    Target Date 06/19/20      PT SHORT TERM GOAL #2   Title Assess BERG and establish appropriate goal    Baseline 05/23/20 BERG score 43, goal is 51    Time 4    Period Weeks    Status Achieved    Target Date 06/19/20      PT SHORT TERM GOAL #3   Title Assess FGA/DGI and set appropriate goal    Baseline FGA patially completed due to time constraints; 05/24/20 FGA score 13, goal is 21    Time 5    Period Weeks    Status Achieved    Target Date 06/19/20      PT SHORT TERM GOAL #4   Title Patient to ambulate 566ft w/o need of AD with SBA across outdoor surfaces    Baseline 131ft in clinic under SBA w/o need of AD; 06/19/20 Ambulation of 547ft outside w/o ned of AD under S    Time 5    Period Weeks    Status Achieved    Target Date 06/19/20      PT SHORT TERM GOAL #5   Title Patient to negotiate full flight of stairs with most appropriate pattern and  support    Baseline 4 steps with single rail and step to pattern; 06/19/20 Able to negotiate 16steps with B rails and step through pattern under S    Time 5    Period Weeks    Status Achieved    Target Date 06/19/20               PT Long Term Goals - 07/10/20 1102       PT LONG TERM GOAL #1   Title patient to demo final HEP back to PT    Baseline TBD; 07/03/20 HEP reviewed verbally    Time 9    Period Weeks    Status On-going      PT LONG TERM GOAL #2   Title Patient to increase FOTO score to 65    Baseline Initial FOTO score 54    Time 9    Period Weeks    Status New      PT LONG TERM GOAL #3   Title Patient to demo gait velocity of 0.8 m/s w/o AD    Baseline 0.55 m/s w/o AD; 06/19/20 gait velocity 0.59 m/s; 07/05/20 gait velocity 0.73 m/s    Time 9    Period Weeks    Status On-going      PT LONG TERM GOAL #4   Title Patient to ambulate 1032ft across outdoor surfaces w/o AD under S    Baseline 18ft across leel indoor surface w/o AD under SBA; 07/05/20 1268ft w/o AD under S across all outdoor surfaces    Time 9    Period Weeks    Status Achieved      PT LONG TERM GOAL #5   Title Patient to perform TUG w/o AD in 13s; 06/19/20 TUG w/o AD 15.4s; 07/05/20 TUG score 11.7s    Baseline Initial TUG w/o AD 19.02s    Time 9    Period Weeks    Status Achieved      PT LONG TERM GOAL #6   Title patient to perform 5x STS test w/o UE support in 15s    Baseline 5x STS score w/o UE assist 22.16s; 06/19/20 5x STS with  UE support 21.1s; 07/05/20 5x STS 16.9s    Time 9    Period Weeks    Status On-going                   Plan - 07/19/20 1029     Clinical Impression Statement Continued with strengthening and conditioning, UE/LE dissiciation activities, increased resistance level on Scifit.  Continued dual tasking or UE activity incorporated into balance tasks.  Added M/L direction on rocker board to ball catch.  Used green sitting disk to increase balance challenge when  seated.  Able to perform all asks with increased difficulty incident    Personal Factors and Comorbidities Comorbidity 1    Examination-Activity Limitations Locomotion Level;Stairs    Examination-Participation Restrictions Occupation    Stability/Clinical Decision Making Stable/Uncomplicated    Rehab Potential Good    PT Frequency 2x / week    PT Duration 8 weeks    PT Treatment/Interventions ADLs/Self Care Home Management;Aquatic Therapy;Gait training;Stair training;Functional mobility training;Therapeutic activities;Therapeutic exercise;Balance training;Neuromuscular re-education;Patient/family education    PT Next Visit Plan continue balance tasks, uneven surfaces, narrow BOS and incorporate UE tasks to offer balance challenges and distractions, tandem walking    PT Home Exercise Plan [id number]    Consulted and Agree with Plan of Care Patient             Patient will benefit from skilled therapeutic intervention in order to improve the following deficits and impairments:  Abnormal gait, Difficulty walking, Decreased endurance, Decreased activity tolerance, Decreased balance, Decreased mobility, Decreased strength  Visit Diagnosis: Hemiplegia and hemiparesis following cerebral infarction affecting left non-dominant side (HCC)  Unsteadiness on feet  Other abnormalities of gait and mobility     Problem List Patient Active Problem List   Diagnosis Date Noted   Stroke (HCC) 04/27/2020   Elevated blood-pressure reading without diagnosis of hypertension 04/27/2020    Hildred Laser PT 07/19/2020, 11:02 AM  [facility] [facility] [address] [place], [place], [address] Phone: [phone]   Fax:  [phone]  Name: Randy Robertson MRN: [id number] Date of Birth: 29-Aug-1950

## 2020-07-24 ENCOUNTER — Ambulatory Visit: Payer: Medicare HMO

## 2020-07-24 ENCOUNTER — Other Ambulatory Visit: Payer: Self-pay

## 2020-07-24 DIAGNOSIS — I69354 Hemiplegia and hemiparesis following cerebral infarction affecting left non-dominant side: Secondary | ICD-10-CM

## 2020-07-24 DIAGNOSIS — R2689 Other abnormalities of gait and mobility: Secondary | ICD-10-CM | POA: Diagnosis not present

## 2020-07-24 DIAGNOSIS — R2681 Unsteadiness on feet: Secondary | ICD-10-CM

## 2020-07-24 NOTE — Therapy (Signed)
[facility] [address] [place], [place], [address] Phone: [phone]   Fax:  [phone]  Physical Therapy Treatment/Progress Note  Patient Details  Name: Randy Robertson MRN: [id number] Date of Birth: [birth year] Referring Provider (PT): Otilio Carpen MD   Encounter Date: 07/24/2020    Dates of Reporting Period:05/14/20-07/24/20  See Note below for Objective Data and Assessment of Progress/Goals.     PT End of Session - 07/24/20 1317     Visit Number 20    Number of Visits 25    Date for PT Re-Evaluation 08/09/20    Authorization Type [employer]    Progress Note Due on Visit 20    PT Start Time 1315    PT Stop Time 1400    PT Time Calculation (min) 45 min    Equipment Utilized During Treatment Gait belt    Activity Tolerance Patient tolerated treatment well    Behavior During Therapy WFL for tasks assessed/performed             Past Medical History:  Diagnosis Date   Stroke (cerebrum) [facility])     Past Surgical History:  Procedure Laterality Date   implantable loop recorder placement  06/22/2020   Medtronic Reveal Linq model [id number] implantable loop recorder  (SN [id number])  for cryptogenic stroke   NO PAST SURGERIES      There were no vitals filed for this visit.       [facility] PT Assessment - 07/24/20 0001       Ambulation/Gait   Gait velocity 0.72 m/s                           [facility] Adult PT Treatment/Exercise - 07/24/20 0001       Ambulation/Gait   Ambulation/Gait Yes    Ambulation/Gait Assistance 5: Supervision    Ambulation Distance (Feet) 230 Feet    Assistive device None    Gait Pattern Step-through pattern    Ambulation Surface Level;Indoor    Gait Comments performed ball catch, 166ft CW/CCW      Lumbar Exercises: Seated   Other Seated Lumbar Exercises core exercises of seated hip tosses, shoulder tosses, chops, Victories, 12 reps each with 6#  ball       Knee/Hip Exercises: Aerobic   Other Aerobic Scifit L4 arms 8 8'      Knee/Hip Exercises: Seated   Long Arc Quad Strengthening;Both;1 set;15 reps;Limitations;Weights    Long Arc Quad Weight 3 lbs.    Long [employer] Limitations performed with latissimus press and 2# wts on green balance disk with alt pattern    Marching Strengthening;Both;1 set;15 reps;Limitations    Marching Limitations Performed with latissimus press and 2# wt from green balance disk in alternating pattern    Marching Weights 3 lbs.                 Balance Exercises - 07/24/20 0001       Balance Exercises: Standing   SLS with Vectors Foam/compliant surface;Limitations    SLS with Vectors Limitations tapping floor targets, 2 unilateral, 2 cross body, 2 alternating, alt single target    Other Standing Exercises standing with foam roll support performing opposite UE/LE abd, 15x, 3# at ankles                 PT Short Term Goals - 06/19/20 1028       PT SHORT TERM GOAL #1   Title patient  to demo initial HEP back to PT    Baseline 05/23/20 began 5x STS BID; 06/19/20 Patient able to demo HEP back w/o need of cuing    Time 4    Period Weeks    Status Achieved    Target Date 06/19/20      PT SHORT TERM GOAL #2   Title Assess BERG and establish appropriate goal    Baseline 05/23/20 BERG score 43, goal is 51    Time 4    Period Weeks    Status Achieved    Target Date 06/19/20      PT SHORT TERM GOAL #3   Title Assess FGA/DGI and set appropriate goal    Baseline FGA patially completed due to time constraints; 05/24/20 FGA score 13, goal is 21    Time 5    Period Weeks    Status Achieved    Target Date 06/19/20      PT SHORT TERM GOAL #4   Title Patient to ambulate 598ft w/o need of AD with SBA across outdoor surfaces    Baseline 163ft in clinic under SBA w/o need of AD; 06/19/20 Ambulation of 532ft outside w/o ned of AD under S    Time 5    Period Weeks    Status Achieved    Target Date 06/19/20       PT SHORT TERM GOAL #5   Title Patient to negotiate full flight of stairs with most appropriate pattern and support    Baseline 4 steps with single rail and step to pattern; 06/19/20 Able to negotiate 16steps with B rails and step through pattern under S    Time 5    Period Weeks    Status Achieved    Target Date 06/19/20               PT Long Term Goals - 07/10/20 1102       PT LONG TERM GOAL #1   Title patient to demo final HEP back to PT    Baseline TBD; 07/03/20 HEP reviewed verbally    Time 9    Period Weeks    Status On-going      PT LONG TERM GOAL #2   Title Patient to increase FOTO score to 65    Baseline Initial FOTO score 54    Time 9    Period Weeks    Status New      PT LONG TERM GOAL #3   Title Patient to demo gait velocity of 0.8 m/s w/o AD    Baseline 0.55 m/s w/o AD; 06/19/20 gait velocity 0.59 m/s; 07/05/20 gait velocity 0.73 m/s    Time 9    Period Weeks    Status On-going      PT LONG TERM GOAL #4   Title Patient to ambulate 1032ft across outdoor surfaces w/o AD under S    Baseline 148ft across leel indoor surface w/o AD under SBA; 07/05/20 1234ft w/o AD under S across all outdoor surfaces    Time 9    Period Weeks    Status Achieved      PT LONG TERM GOAL #5   Title Patient to perform TUG w/o AD in 13s; 06/19/20 TUG w/o AD 15.4s; 07/05/20 TUG score 11.7s    Baseline Initial TUG w/o AD 19.02s    Time 9    Period Weeks    Status Achieved      PT LONG TERM GOAL #6   Title patient to  perform 5x STS test w/o UE support in 15s    Baseline 5x STS score w/o UE assist 22.16s; 06/19/20 5x STS with UE support 21.1s; 07/05/20 5x STS 16.9s    Time 9    Period Weeks    Status On-going                   Plan - 07/24/20 1432     Clinical Impression Statement Continued strength, coordination and balance training.  Improved performannce with SLS tasks but still hesitant about WB on single leg.  Gait speed has improved fro .59 to .72 m/s, he is  able to tolerate increased resstance and workloads w/o distress, UE motion with gait improving.  Patient progressing well towards goals and showing improved function and balance.    Personal Factors and Comorbidities Comorbidity 1    Examination-Activity Limitations Locomotion Level;Stairs    Examination-Participation Restrictions Occupation    Stability/Clinical Decision Making Stable/Uncomplicated    Rehab Potential Good    PT Frequency 2x / week    PT Duration 8 weeks    PT Treatment/Interventions ADLs/Self Care Home Management;Aquatic Therapy;Gait training;Stair training;Functional mobility training;Therapeutic activities;Therapeutic exercise;Balance training;Neuromuscular re-education;Patient/family education    PT Next Visit Plan narrow BOS and incorporate UE tasks to offer balance challenges and distractions, tandem walking, SLS and floor targets    PT Home Exercise Plan [id number]    Consulted and Agree with Plan of Care Patient             Patient will benefit from skilled therapeutic intervention in order to improve the following deficits and impairments:  Abnormal gait, Difficulty walking, Decreased endurance, Decreased activity tolerance, Decreased balance, Decreased mobility, Decreased strength  Visit Diagnosis: Hemiplegia and hemiparesis following cerebral infarction affecting left non-dominant side (HCC)  Unsteadiness on feet  Other abnormalities of gait and mobility     Problem List Patient Active Problem List   Diagnosis Date Noted   Stroke (HCC) 04/27/2020   Elevated blood-pressure reading without diagnosis of hypertension 04/27/2020    Hildred Laser PT 07/24/2020, 2:40 PM   [facility] [address] [place], [place], [address] Phone: [phone]   Fax:  [phone]  Name: Smaran Gaus MRN: [id number] Date of Birth: [birth year]

## 2020-07-25 ENCOUNTER — Ambulatory Visit (INDEPENDENT_AMBULATORY_CARE_PROVIDER_SITE_OTHER): Payer: Medicare HMO

## 2020-07-25 DIAGNOSIS — I639 Cerebral infarction, unspecified: Secondary | ICD-10-CM

## 2020-07-25 LAB — CUP PACEART REMOTE DEVICE CHECK
Date Time Interrogation Session: 20220720092853
Implantable Pulse Generator Implant Date: 20220617

## 2020-07-26 ENCOUNTER — Ambulatory Visit: Payer: Medicare HMO | Admitting: Rehabilitative and Restorative Service Providers"

## 2020-07-26 ENCOUNTER — Other Ambulatory Visit: Payer: Self-pay

## 2020-07-26 DIAGNOSIS — Z7902 Long term (current) use of antithrombotics/antiplatelets: Secondary | ICD-10-CM | POA: Diagnosis not present

## 2020-07-26 DIAGNOSIS — Z1329 Encounter for screening for other suspected endocrine disorder: Secondary | ICD-10-CM | POA: Diagnosis not present

## 2020-07-26 DIAGNOSIS — Z125 Encounter for screening for malignant neoplasm of prostate: Secondary | ICD-10-CM | POA: Diagnosis not present

## 2020-07-26 DIAGNOSIS — R2681 Unsteadiness on feet: Secondary | ICD-10-CM | POA: Diagnosis not present

## 2020-07-26 DIAGNOSIS — I69359 Hemiplegia and hemiparesis following cerebral infarction affecting unspecified side: Secondary | ICD-10-CM | POA: Insufficient documentation

## 2020-07-26 DIAGNOSIS — R2689 Other abnormalities of gait and mobility: Secondary | ICD-10-CM

## 2020-07-26 DIAGNOSIS — I10 Essential (primary) hypertension: Secondary | ICD-10-CM | POA: Diagnosis not present

## 2020-07-26 DIAGNOSIS — I69393 Ataxia following cerebral infarction: Secondary | ICD-10-CM | POA: Diagnosis not present

## 2020-07-26 DIAGNOSIS — R7301 Impaired fasting glucose: Secondary | ICD-10-CM | POA: Diagnosis not present

## 2020-07-26 DIAGNOSIS — I6990 Unspecified sequelae of unspecified cerebrovascular disease: Secondary | ICD-10-CM | POA: Insufficient documentation

## 2020-07-26 DIAGNOSIS — I69354 Hemiplegia and hemiparesis following cerebral infarction affecting left non-dominant side: Secondary | ICD-10-CM

## 2020-07-26 DIAGNOSIS — Z79899 Other long term (current) drug therapy: Secondary | ICD-10-CM | POA: Diagnosis not present

## 2020-07-26 DIAGNOSIS — E7849 Other hyperlipidemia: Secondary | ICD-10-CM | POA: Diagnosis not present

## 2020-07-26 DIAGNOSIS — Z1211 Encounter for screening for malignant neoplasm of colon: Secondary | ICD-10-CM | POA: Diagnosis not present

## 2020-07-26 NOTE — Therapy (Signed)
[facility] [address] [place], [place], [address] Phone: [phone]   Fax:  [phone]  Physical Therapy Treatment  Patient Details  Name: Randy Robertson MRN: [id number] Date of Birth: November 22, 1950 Referring Provider (PT): Otilio Carpen MD   Encounter Date: 07/26/2020   PT End of Session - 07/26/20 1246     Visit Number 21    Number of Visits 25    Date for PT Re-Evaluation 08/09/20    Authorization Type [employer]-- 9 visits 7/10-9/10    Authorization - Visit Number 3    Authorization - Number of Visits 9    Progress Note Due on Visit 30    PT Start Time 1147    PT Stop Time 1228    PT Time Calculation (min) 41 min    Equipment Utilized During Treatment Gait belt    Activity Tolerance Patient tolerated treatment well    Behavior During Therapy [facility] for tasks assessed/performed             Past Medical History:  Diagnosis Date   Stroke (cerebrum) [facility]     Past Surgical History:  Procedure Laterality Date   implantable loop recorder placement  06/22/2020   Medtronic Reveal Linq model [id number] implantable loop recorder  (SN [id number])  for cryptogenic stroke   NO PAST SURGERIES      There were no vitals filed for this visit.   Subjective Assessment - 07/26/20 1150     Subjective The patient reports his interpreter is not here yet.  He requests to start session.  "It's everyday better."    Patient is accompained by: Interpreter   arrived 8-10 minutes into session.   Pertinent History Randy Robertson is a 70 y.o. male with no significant past medical history who presents 4/21 from [facility]  ER for stroke work-up due to left arm weakness and unsteady gait.  [id number] acute ischemia within the right MCA territory.    Patient Stated Goals To walk better    Currently in Pain? No/denies                               [facility] Adult PT Treatment/Exercise - 07/26/20 1155        Ambulation/Gait   Ambulation/Gait Yes    Ambulation/Gait Assistance 6: Modified independent (Device/Increase time)    Ambulation Distance (Feet) 200 Feet    Assistive device None    Gait Pattern Step-through pattern   Note dec'd L knee extension at initial contact of stance phase of gait   Ambulation Surface Level;Indoor    Gait Comments Dynamic gait activities with vertical and horizontal head turns with CGA, backwards walking, and tandem gait      Therapeutic Activites    Therapeutic Activities Other Therapeutic Activities    Other Therapeutic Activities create unlevel surface on incline/decline to replicate sensation of walking to garden performing carrying 10 lb kettle bell up/down ramp with CGA for safety      Exercises   Exercises Other Exercises;Knee/Hip    Other Exercises  nu-step x 3 minutes UEs/LEs      Knee/Hip Exercises: Standing   Heel Raises Right;Left;10 reps    Heel Raises Limitations in single limb stance with UE support    SLS with Vectors single leg stance reaching to touch chair (the diver exercise) in L LE stance with CGA  Balance Exercises - 07/26/20 1234       Balance Exercises: Standing   SLS Eyes open;Intermittent upper extremity support;3 reps   with cues for core engagement and lateral hip activation   SLS with Vectors Foam/compliant surface;Intermittent upper extremity assist    SLS with Vectors Limitations Standing on compliant foam on incline with cone tapping R and L with intermittent HHA    Tandem Gait Forward;Intermittent upper extremity support    Retro Gait --   x 20 feet x 3 reps with SBA   Sidestepping 1 rep    Sidestepping Limitations 20 feet R and L sides with mini squat    Marching Solid surface;Foam/compliant surface;Dynamic    Marching Limitations Performing marching on compliant mat and incline/decline surface with CGA    Other Standing Exercises foam standing with eyes open and closed on ramp with CGA for safety                   PT Short Term Goals - 06/19/20 1028       PT SHORT TERM GOAL #1   Title patient to demo initial HEP back to PT    Baseline 05/23/20 began 5x STS BID; 06/19/20 Patient able to demo HEP back w/o need of cuing    Time 4    Period Weeks    Status Achieved    Target Date 06/19/20      PT SHORT TERM GOAL #2   Title Assess BERG and establish appropriate goal    Baseline 05/23/20 BERG score 43, goal is 51    Time 4    Period Weeks    Status Achieved    Target Date 06/19/20      PT SHORT TERM GOAL #3   Title Assess FGA/DGI and set appropriate goal    Baseline FGA patially completed due to time constraints; 05/24/20 FGA score 13, goal is 21    Time 5    Period Weeks    Status Achieved    Target Date 06/19/20      PT SHORT TERM GOAL #4   Title Patient to ambulate 52ft w/o need of AD with SBA across outdoor surfaces    Baseline 157ft in clinic under SBA w/o need of AD; 06/19/20 Ambulation of 518ft outside w/o ned of AD under S    Time 5    Period Weeks    Status Achieved    Target Date 06/19/20      PT SHORT TERM GOAL #5   Title Patient to negotiate full flight of stairs with most appropriate pattern and support    Baseline 4 steps with single rail and step to pattern; 06/19/20 Able to negotiate 16steps with B rails and step through pattern under S    Time 5    Period Weeks    Status Achieved    Target Date 06/19/20               PT Long Term Goals - 07/10/20 1102       PT LONG TERM GOAL #1   Title patient to demo final HEP back to PT    Baseline TBD; 07/03/20 HEP reviewed verbally    Time 9    Period Weeks    Status On-going      PT LONG TERM GOAL #2   Title Patient to increase FOTO score to 65    Baseline Initial FOTO score 54    Time 9    Period Weeks  Status New      PT LONG TERM GOAL #3   Title Patient to demo gait velocity of 0.8 m/s w/o AD    Baseline 0.55 m/s w/o AD; 06/19/20 gait velocity 0.59 m/s; 07/05/20 gait velocity 0.73 m/s     Time 9    Period Weeks    Status On-going      PT LONG TERM GOAL #4   Title Patient to ambulate 1053ft across outdoor surfaces w/o AD under S    Baseline 137ft across leel indoor surface w/o AD under SBA; 07/05/20 1281ft w/o AD under S across all outdoor surfaces    Time 9    Period Weeks    Status Achieved      PT LONG TERM GOAL #5   Title Patient to perform TUG w/o AD in 13s; 06/19/20 TUG w/o AD 15.4s; 07/05/20 TUG score 11.7s    Baseline Initial TUG w/o AD 19.02s    Time 9    Period Weeks    Status Achieved      PT LONG TERM GOAL #6   Title patient to perform 5x STS test w/o UE support in 15s    Baseline 5x STS score w/o UE assist 22.16s; 06/19/20 5x STS with UE support 21.1s; 07/05/20 5x STS 16.9s    Time 9    Period Weeks    Status On-going                   Plan - 07/26/20 1247     Clinical Impression Statement The patient has mild gait deviations noted with dec'd L heel strike and knee extension at initiation of stance phase of gait.  PT and patient worked on dynamic gait activities, functional tasks for home, and balance.  Plan to continue working to LTGs.    PT Frequency 2x / week    PT Duration 8 weeks    PT Treatment/Interventions ADLs/Self Care Home Management;Aquatic Therapy;Gait training;Stair training;Functional mobility training;Therapeutic activities;Therapeutic exercise;Balance training;Neuromuscular re-education;Patient/family education    PT Next Visit Plan narrow BOS and incorporate UE tasks to offer balance challenges and distractions, tandem walking, SLS and floor targets    PT Home Exercise Plan [id number]    Consulted and Agree with Plan of Care Patient             Patient will benefit from skilled therapeutic intervention in order to improve the following deficits and impairments:     Visit Diagnosis: Unsteadiness on feet  Other abnormalities of gait and mobility  Hemiplegia and hemiparesis following cerebral infarction affecting left  non-dominant side [facility])     Problem List Patient Active Problem List   Diagnosis Date Noted   Stroke (HCC) 04/27/2020   Elevated blood-pressure reading without diagnosis of hypertension 04/27/2020    Jonasia Coiner, PT 07/26/2020, 12:48 PM  [facility] [address] [place], [place], [address] Phone: [phone]   Fax:  [phone]  Name: Randy Robertson MRN: [id number] Date of Birth: June 18, 1950

## 2020-07-30 ENCOUNTER — Other Ambulatory Visit: Payer: Self-pay

## 2020-07-30 ENCOUNTER — Ambulatory Visit: Payer: Medicare HMO

## 2020-07-30 DIAGNOSIS — R2689 Other abnormalities of gait and mobility: Secondary | ICD-10-CM | POA: Diagnosis not present

## 2020-07-30 DIAGNOSIS — I69354 Hemiplegia and hemiparesis following cerebral infarction affecting left non-dominant side: Secondary | ICD-10-CM | POA: Diagnosis not present

## 2020-07-30 DIAGNOSIS — R2681 Unsteadiness on feet: Secondary | ICD-10-CM

## 2020-07-30 NOTE — Therapy (Signed)
[facility] [address] [place], [place], [address] Phone: [phone]   Fax:  [phone]  Physical Therapy Treatment  Patient Details  Name: Randy Robertson MRN: [id number] Date of Birth: 01-Apr-1950 Referring Provider (PT): Otilio Carpen MD   Encounter Date: 07/30/2020   PT End of Session - 07/30/20 1154     Visit Number 22    Number of Visits 25    Date for PT Re-Evaluation 08/09/20    Authorization Type [employer]-- 9 visits 7/10-9/10    Authorization - Visit Number 3    Authorization - Number of Visits 9    Progress Note Due on Visit 30    PT Start Time 1150    PT Stop Time 1230    PT Time Calculation (min) 40 min    Equipment Utilized During Treatment Gait belt    Activity Tolerance Patient tolerated treatment well    Behavior During Therapy [facility] for tasks assessed/performed             Past Medical History:  Diagnosis Date   Stroke (cerebrum) [facility])     Past Surgical History:  Procedure Laterality Date   implantable loop recorder placement  06/22/2020   Medtronic Reveal Linq model [id number] implantable loop recorder  (SN [id number])  for cryptogenic stroke   NO PAST SURGERIES      There were no vitals filed for this visit.   Subjective Assessment - 07/30/20 1153     Subjective BP has been under control, monitored daily.  Feels improvement each day.    Patient is accompained by: Interpreter   arrived 8-10 minutes into session.   Pertinent History Randy Robertson is a 70 y.o. male with no significant past medical history who presents 4/21 from [facility]  ER for stroke work-up due to left arm weakness and unsteady gait.  [id number] acute ischemia within the right MCA territory.    Limitations Walking    Patient Stated Goals To walk better                               [facility] Adult PT Treatment/Exercise - 07/30/20 0001       Ambulation/Gait   Ramp 5: Supervision;Other  (comment)    Ramp Details (indicate cue type and reason) fwd, bwd and sisestep, 5 trips ea. under PT S      Lumbar Exercises: Standing   Other Standing Lumbar Exercises deadlift from floor, 15# KB 10x      Knee/Hip Exercises: Aerobic   Other Aerobic Scifit L4 arms 8 8'                 Balance Exercises - 07/30/20 0001       Balance Exercises: Standing   Rockerboard Anterior/posterior;Intermittent UE support;Limitations    Rockerboard Limitations taping floor targets, 2 unilateral, 2 alt, 2 cross body, 1 cross body all for 10 reps per LE    Tandem Gait Forward;Intermittent upper extremity support    Tandem Gait Limitations foam beam, 5 trips fwd /bwd                 PT Short Term Goals - 06/19/20 1028       PT SHORT TERM GOAL #1   Title patient to demo initial HEP back to PT    Baseline 05/23/20 began 5x STS BID; 06/19/20 Patient able to demo HEP back w/o need of cuing    Time 4  Period Weeks    Status Achieved    Target Date 06/19/20      PT SHORT TERM GOAL #2   Title Assess BERG and establish appropriate goal    Baseline 05/23/20 BERG score 43, goal is 51    Time 4    Period Weeks    Status Achieved    Target Date 06/19/20      PT SHORT TERM GOAL #3   Title Assess FGA/DGI and set appropriate goal    Baseline FGA patially completed due to time constraints; 05/24/20 FGA score 13, goal is 21    Time 5    Period Weeks    Status Achieved    Target Date 06/19/20      PT SHORT TERM GOAL #4   Title Patient to ambulate 544ft w/o need of AD with SBA across outdoor surfaces    Baseline 138ft in clinic under SBA w/o need of AD; 06/19/20 Ambulation of 529ft outside w/o ned of AD under S    Time 5    Period Weeks    Status Achieved    Target Date 06/19/20      PT SHORT TERM GOAL #5   Title Patient to negotiate full flight of stairs with most appropriate pattern and support    Baseline 4 steps with single rail and step to pattern; 06/19/20 Able to negotiate  16steps with B rails and step through pattern under S    Time 5    Period Weeks    Status Achieved    Target Date 06/19/20               PT Long Term Goals - 07/10/20 1102       PT LONG TERM GOAL #1   Title patient to demo final HEP back to PT    Baseline TBD; 07/03/20 HEP reviewed verbally    Time 9    Period Weeks    Status On-going      PT LONG TERM GOAL #2   Title Patient to increase FOTO score to 65    Baseline Initial FOTO score 54    Time 9    Period Weeks    Status New      PT LONG TERM GOAL #3   Title Patient to demo gait velocity of 0.8 m/s w/o AD    Baseline 0.55 m/s w/o AD; 06/19/20 gait velocity 0.59 m/s; 07/05/20 gait velocity 0.73 m/s    Time 9    Period Weeks    Status On-going      PT LONG TERM GOAL #4   Title Patient to ambulate 1087ft across outdoor surfaces w/o AD under S    Baseline 151ft across leel indoor surface w/o AD under SBA; 07/05/20 1246ft w/o AD under S across all outdoor surfaces    Time 9    Period Weeks    Status Achieved      PT LONG TERM GOAL #5   Title Patient to perform TUG w/o AD in 13s; 06/19/20 TUG w/o AD 15.4s; 07/05/20 TUG score 11.7s    Baseline Initial TUG w/o AD 19.02s    Time 9    Period Weeks    Status Achieved      PT LONG TERM GOAL #6   Title patient to perform 5x STS test w/o UE support in 15s    Baseline 5x STS score w/o UE assist 22.16s; 06/19/20 5x STS with UE support 21.1s; 07/05/20 5x STS 16.9s    Time  9    Period Weeks    Status On-going                   Plan - 07/30/20 1231     Clinical Impression Statement Contiued dynamic gait tasks on compliant surfaces to address balance issues and lateral instability.  Performed functional balance training on ramp, solid surface walking fwd, bwd and sideways to simulate gardening tasks.  Most difficulty noted with tandem and small BOS exercises, still adopts head down posture to focus on feet.    Personal Factors and Comorbidities Comorbidity 1     Comorbidities CVA    Examination-Activity Limitations Locomotion Level;Stairs    Stability/Clinical Decision Making Stable/Uncomplicated    Clinical Decision Making Low    Rehab Potential Good    PT Frequency 2x / week    PT Duration 8 weeks    PT Treatment/Interventions ADLs/Self Care Home Management;Aquatic Therapy;Gait training;Stair training;Functional mobility training;Therapeutic activities;Therapeutic exercise;Balance training;Neuromuscular re-education;Patient/family education    PT Next Visit Plan narrow BOS and incorporate UE tasks to offer balance challenges and distractions, tandem walking, SLS and floor targets, discourage head down posture    PT Home Exercise Plan [id number]    Consulted and Agree with Plan of Care Patient             Patient will benefit from skilled therapeutic intervention in order to improve the following deficits and impairments:  Abnormal gait, Difficulty walking, Decreased endurance, Decreased activity tolerance, Decreased balance, Decreased mobility, Decreased strength  Visit Diagnosis: Unsteadiness on feet  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

## 2020-08-01 ENCOUNTER — Ambulatory Visit: Payer: Medicare HMO

## 2020-08-01 ENCOUNTER — Other Ambulatory Visit: Payer: Self-pay

## 2020-08-01 DIAGNOSIS — I69354 Hemiplegia and hemiparesis following cerebral infarction affecting left non-dominant side: Secondary | ICD-10-CM | POA: Diagnosis not present

## 2020-08-01 DIAGNOSIS — R2689 Other abnormalities of gait and mobility: Secondary | ICD-10-CM | POA: Diagnosis not present

## 2020-08-01 DIAGNOSIS — R2681 Unsteadiness on feet: Secondary | ICD-10-CM | POA: Diagnosis not present

## 2020-08-01 NOTE — Therapy (Signed)
[facility] [address] [place], [place], [address] Phone: [phone]   Fax:  [phone]  Physical Therapy Treatment  Patient Details  Name: Randy Robertson MRN: [id number] Date of Birth: 1950/11/16 Referring Provider (PT): Otilio Carpen MD   Encounter Date: 08/01/2020   PT End of Session - 08/01/20 1021     Visit Number 23    Number of Visits 25    Date for PT Re-Evaluation 08/09/20    Authorization Type [employer]-- 9 visits 7/10-9/10    Authorization - Visit Number 3    Authorization - Number of Visits 9    Progress Note Due on Visit 30    PT Start Time 1015    PT Stop Time 1100    PT Time Calculation (min) 45 min    Equipment Utilized During Treatment Gait belt    Activity Tolerance Patient tolerated treatment well    Behavior During Therapy [facility] for tasks assessed/performed             Past Medical History:  Diagnosis Date   Stroke (cerebrum) [facility])     Past Surgical History:  Procedure Laterality Date   implantable loop recorder placement  06/22/2020   Medtronic Reveal Linq model [id number] implantable loop recorder  (SN [id number])  for cryptogenic stroke   NO PAST SURGERIES      There were no vitals filed for this visit.   Subjective Assessment - 08/01/20 1019     Subjective Reports he fatigues easily and then has balance issues.  Only cites balance issue when tired or in darkened environment    Patient is accompained by: Interpreter   arrived 8-10 minutes into session.   Pertinent History Randy Robertson is a 70 y.o. male with no significant past medical history who presents 4/21 from [facility]  ER for stroke work-up due to left arm weakness and unsteady gait.  JSH:[id number] acute ischemia within the right MCA territory.    Limitations Walking    Patient Stated Goals To walk better                               [facility] Adult PT Treatment/Exercise - 08/01/20 0001        Ambulation/Gait   Ramp 5: Supervision;Other (comment)    Ramp Details (indicate cue type and reason) fwd, bwd and sidestepping w/o need of UE support      Knee/Hip Exercises: Aerobic   Other Aerobic Scifit L4 arms 8 8'                 Balance Exercises - 08/01/20 0001       Balance Exercises: Standing   Rockerboard Anterior/posterior    Rockerboard Limitations tapping floor targets(cones), 2 unilateral, 2 alt, 2 cross body, 1 cross body all for 10 reps per LE, no need of UE assist    Tandem Gait Forward;Intermittent upper extremity support    Tandem Gait Limitations foam beam, 5 trips fwd /bwd                 PT Short Term Goals - 06/19/20 1028       PT SHORT TERM GOAL #1   Title patient to demo initial HEP back to PT    Baseline 05/23/20 began 5x STS BID; 06/19/20 Patient able to demo HEP back w/o need of cuing    Time 4    Period Weeks    Status Achieved  Target Date 06/19/20      PT SHORT TERM GOAL #2   Title Assess BERG and establish appropriate goal    Baseline 05/23/20 BERG score 43, goal is 51    Time 4    Period Weeks    Status Achieved    Target Date 06/19/20      PT SHORT TERM GOAL #3   Title Assess FGA/DGI and set appropriate goal    Baseline FGA patially completed due to time constraints; 05/24/20 FGA score 13, goal is 21    Time 5    Period Weeks    Status Achieved    Target Date 06/19/20      PT SHORT TERM GOAL #4   Title Patient to ambulate 557ft w/o need of AD with SBA across outdoor surfaces    Baseline 15ft in clinic under SBA w/o need of AD; 06/19/20 Ambulation of 59ft outside w/o ned of AD under S    Time 5    Period Weeks    Status Achieved    Target Date 06/19/20      PT SHORT TERM GOAL #5   Title Patient to negotiate full flight of stairs with most appropriate pattern and support    Baseline 4 steps with single rail and step to pattern; 06/19/20 Able to negotiate 16steps with B rails and step through pattern under S    Time  5    Period Weeks    Status Achieved    Target Date 06/19/20               PT Long Term Goals - 07/10/20 1102       PT LONG TERM GOAL #1   Title patient to demo final HEP back to PT    Baseline TBD; 07/03/20 HEP reviewed verbally    Time 9    Period Weeks    Status On-going      PT LONG TERM GOAL #2   Title Patient to increase FOTO score to 65    Baseline Initial FOTO score 54    Time 9    Period Weeks    Status New      PT LONG TERM GOAL #3   Title Patient to demo gait velocity of 0.8 m/s w/o AD    Baseline 0.55 m/s w/o AD; 06/19/20 gait velocity 0.59 m/s; 07/05/20 gait velocity 0.73 m/s    Time 9    Period Weeks    Status On-going      PT LONG TERM GOAL #4   Title Patient to ambulate 1041ft across outdoor surfaces w/o AD under S    Baseline 171ft across leel indoor surface w/o AD under SBA; 07/05/20 1266ft w/o AD under S across all outdoor surfaces    Time 9    Period Weeks    Status Achieved      PT LONG TERM GOAL #5   Title Patient to perform TUG w/o AD in 13s; 06/19/20 TUG w/o AD 15.4s; 07/05/20 TUG score 11.7s    Baseline Initial TUG w/o AD 19.02s    Time 9    Period Weeks    Status Achieved      PT LONG TERM GOAL #6   Title patient to perform 5x STS test w/o UE support in 15s    Baseline 5x STS score w/o UE assist 22.16s; 06/19/20 5x STS with UE support 21.1s; 07/05/20 5x STS 16.9s    Time 9    Period Weeks    Status  On-going                   Plan - 08/01/20 1103     Clinical Impression Statement todays session continued to focus on dynamic balance tasks and negotiating uneven surfaces w/o UE support.  Observation of balance activities found patient to perform tasks with more confidence and speed with only occaisioal taps to bars/walls to maintain balance, no overt LOB noted.  Ramp negotiation has improved as patient showing increased speed an step length.  He continues with limitd UE dissociation when walking.  He reports mild deficits with EC  and dark environments and will adress over next 2 sessions.    Personal Factors and Comorbidities Comorbidity 1    Comorbidities CVA    Examination-Activity Limitations Locomotion Level;Stairs    Stability/Clinical Decision Making Stable/Uncomplicated    Rehab Potential Good    PT Frequency 2x / week    PT Duration 4 weeks    PT Treatment/Interventions ADLs/Self Care Home Management;Aquatic Therapy;Gait training;Stair training;Functional mobility training;Therapeutic activities;Therapeutic exercise;Balance training;Neuromuscular re-education;Patient/family education    PT Next Visit Plan EC environment, head turn tasks, foam across ramp while carrying objects    PT Home Exercise Plan [id number]    Consulted and Agree with Plan of Care Patient             Patient will benefit from skilled therapeutic intervention in order to improve the following deficits and impairments:  Abnormal gait, Difficulty walking, Decreased endurance, Decreased activity tolerance, Decreased balance, Decreased mobility, Decreased strength  Visit Diagnosis: Unsteadiness on feet  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 [place], [place], [address] Phone: [phone]   Fax:  [phone]  Name: Randy Robertson MRN: [id number] Date of Birth: 1950/03/11

## 2020-08-14 ENCOUNTER — Ambulatory Visit: Payer: Medicare HMO | Attending: Internal Medicine

## 2020-08-14 DIAGNOSIS — R2689 Other abnormalities of gait and mobility: Secondary | ICD-10-CM | POA: Diagnosis not present

## 2020-08-14 DIAGNOSIS — R2681 Unsteadiness on feet: Secondary | ICD-10-CM | POA: Diagnosis not present

## 2020-08-14 DIAGNOSIS — I69354 Hemiplegia and hemiparesis following cerebral infarction affecting left non-dominant side: Secondary | ICD-10-CM | POA: Insufficient documentation

## 2020-08-14 NOTE — Therapy (Signed)
[facility] [address] [place], [place], [address] Phone: [phone]   Fax:  [phone]  Physical Therapy Treatment  Patient Details  Name: Randy Robertson MRN: [id number] Date of Birth: [birth year] Referring Provider (PT): Fredia Beets MD   Encounter Date: 08/14/2020   PT End of Session - 08/14/20 1320     Visit Number 24    Number of Visits 25    Date for PT Re-Evaluation 08/09/20    Authorization Type [employer]-- 9 visits 7/10-9/10    Authorization - Visit Number 3    Authorization - Number of Visits 9    Progress Note Due on Visit 30    PT Start Time 1315    PT Stop Time 1400    PT Time Calculation (min) 45 min    Equipment Utilized During Treatment Gait belt    Activity Tolerance Patient tolerated treatment well    Behavior During Therapy [facility] for tasks assessed/performed             Past Medical History:  Diagnosis Date   Stroke (cerebrum) [facility])     Past Surgical History:  Procedure Laterality Date   implantable loop recorder placement  06/22/2020   Medtronic Reveal Linq model [id number] implantable loop recorder  (SN [id number])  for cryptogenic stroke   NO PAST SURGERIES      There were no vitals filed for this visit.   Subjective Assessment - 08/14/20 1317     Subjective No new c/o, feels his ablance has greatly improved over the past week    Patient is accompained by: Interpreter   arrived 8-10 minutes into session.   Pertinent History Randy Robertson is a 70 y.o. male with no significant past medical history who presents 4/21 from [facility]  ER for stroke work-up due to left arm weakness and unsteady gait.  XNA:TFTDDUKGUR acute ischemia within the right MCA territory.    Limitations Walking    Patient Stated Goals To walk better                [facility] PT Assessment - 08/14/20 0001       Transfers   Five time sit to stand comments  5x STS 13.2s      Ambulation/Gait   Gait  velocity 0.98 m/s      Functional Gait  Assessment   Gait assessed  Yes    Gait Level Surface Walks 20 ft in less than 7 sec but greater than 5.5 sec, uses assistive device, slower speed, mild gait deviations, or deviates 6-10 in outside of the 12 in walkway width.    Change in Gait Speed Able to smoothly change walking speed without loss of balance or gait deviation. Deviate no more than 6 in outside of the 12 in walkway width.    Gait with Horizontal Head Turns Performs head turns smoothly with no change in gait. Deviates no more than 6 in outside 12 in walkway width    Gait with Vertical Head Turns Performs head turns with no change in gait. Deviates no more than 6 in outside 12 in walkway width.    Gait and Pivot Turn Pivot turns safely in greater than 3 sec and stops with no loss of balance, or pivot turns safely within 3 sec and stops with mild imbalance, requires small steps to catch balance.    Step Over Obstacle Is able to step over 2 stacked shoe boxes taped together (9 in total height) without changing gait  speed. No evidence of imbalance.    Gait with Narrow Base of Support Ambulates 7-9 steps.    Gait with Eyes Closed Walks 20 ft, slow speed, abnormal gait pattern, evidence for imbalance, deviates 10-15 in outside 12 in walkway width. Requires more than 9 sec to ambulate 20 ft.    Ambulating Backwards Walks 20 ft, uses assistive device, slower speed, mild gait deviations, deviates 6-10 in outside 12 in walkway width.    Steps Alternating feet, must use rail.    Total Score 23                           [facility] Adult PT Treatment/Exercise - 08/14/20 0001       Knee/Hip Exercises: Aerobic   Other Aerobic Scifit L4 arms 8 8'                 Balance Exercises - 08/14/20 0001       Balance Exercises: Standing   Standing Eyes Closed Narrow base of support (BOS);30 secs;Limitations    Standing Eyes Closed Limitations 30s static hold then 10 head turns/nods w/o  LOB observed    Other Standing Exercises standing deadlifts from airex, 15# 10x                 PT Short Term Goals - 06/19/20 1028       PT SHORT TERM GOAL #1   Title patient to demo initial HEP back to PT    Baseline 05/23/20 began 5x STS BID; 06/19/20 Patient able to demo HEP back w/o need of cuing    Time 4    Period Weeks    Status Achieved    Target Date 06/19/20      PT SHORT TERM GOAL #2   Title Assess BERG and establish appropriate goal    Baseline 05/23/20 BERG score 43, goal is 51    Time 4    Period Weeks    Status Achieved    Target Date 06/19/20      PT SHORT TERM GOAL #3   Title Assess FGA/DGI and set appropriate goal    Baseline FGA patially completed due to time constraints; 05/24/20 FGA score 13, goal is 21    Time 5    Period Weeks    Status Achieved    Target Date 06/19/20      PT SHORT TERM GOAL #4   Title Patient to ambulate 585f w/o need of AD with SBA across outdoor surfaces    Baseline 1061fin clinic under SBA w/o need of AD; 06/19/20 Ambulation of 50025[place]side w/o ned of AD under S    Time 5    Period Weeks    Status Achieved    Target Date 06/19/20      PT SHORT TERM GOAL #5   Title Patient to negotiate full flight of stairs with most appropriate pattern and support    Baseline 4 steps with single rail and step to pattern; 06/19/20 Able to negotiate 16steps with B rails and step through pattern under S    Time 5    Period Weeks    Status Achieved    Target Date 06/19/20               PT Long Term Goals - 08/14/20 1412       PT LONG TERM GOAL #1   Title patient to demo final HEP back to PT    Baseline  TBD; 07/03/20 HEP reviewed verbally    Time 9    Period Weeks    Status On-going    Target Date 09/15/20      PT LONG TERM GOAL #2   Title Patient to increase FOTO score to 65    Baseline Initial FOTO score 54    Time 9    Period Weeks    Status New    Target Date 09/15/20      PT LONG TERM GOAL #3   Title Patient to  demo gait velocity of 0.8 m/s w/o AD    Baseline 0.55 m/s w/o AD; 06/19/20 gait velocity 0.59 m/s; 07/05/20 gait velocity 0.73 m/s: 08/14/20 Gait speed 0.98 m/s    Time 9    Period Weeks    Status On-going    Target Date 09/15/20      PT LONG TERM GOAL #4   Title Patient to ambulate 1081f across outdoor surfaces w/o AD under S    Baseline 1046facross leel indoor surface w/o AD under SBA; 07/05/20 120031f/o AD under S across all outdoor surfaces    Time 9    Period Weeks    Status Achieved      PT LONG TERM GOAL #5   Title Patient to perform TUG w/o AD in 13s; 06/19/20 TUG w/o AD 15.4s; 07/05/20 TUG score 11.7s    Baseline Initial TUG w/o AD 19.02s    Time 9    Period Weeks    Status Achieved      PT LONG TERM GOAL #6   Title patient to perform 5x STS test w/o UE support in 15s    Baseline 5x STS score w/o UE assist 22.16s; 06/19/20 5x STS with UE support 21.1s; 07/05/20 5x STS 16.9s; 08/14/20 5x STS score 13.2s    Time 9    Period Weeks    Status Achieved                   Plan - 08/14/20 1416     Clinical Impression Statement Todays session focusd on assessment of goal progress, patient has met 5x STS goal, gait velocity goal and has improved his FGA score to 23.  He feels confident to DC to self management next session.    Personal Factors and Comorbidities Comorbidity 1    Comorbidities CVA    Examination-Activity Limitations Locomotion Level;Stairs    Stability/Clinical Decision Making Stable/Uncomplicated    Rehab Potential Good    PT Frequency 2x / week    PT Duration 4 weeks    PT Treatment/Interventions ADLs/Self Care Home Management;Aquatic Therapy;Gait training;Stair training;Functional mobility training;Therapeutic activities;Therapeutic exercise;Balance training;Neuromuscular re-education;Patient/family education    PT Next Visit Plan FOTO and HEP prior to DC [place] Agree with Plan of Care Patient              Patient will benefit from skilled therapeutic intervention in order to improve the following deficits and impairments:  Abnormal gait, Difficulty walking, Decreased endurance, Decreased activity tolerance, Decreased balance, Decreased mobility, Decreased strength  Visit Diagnosis: Unsteadiness on feet  Hemiplegia and hemiparesis following [facility]   Problem List Patient Active Problem List   Diagnosis Date Noted   [place]   Elevated blood-pressure reading without diagnosis of hypertension 04/27/2020    JefLanice Shirts 08/14/2020, 2:20 PM  [facility]  [address] [place], [place], [address] Phone: [phone]   Fax:  [phone]  Name: Shaman Muscarella MRN: [id number] Date of Birth: 1950-12-24

## 2020-08-16 ENCOUNTER — Other Ambulatory Visit: Payer: Self-pay

## 2020-08-16 ENCOUNTER — Ambulatory Visit: Payer: Medicare HMO

## 2020-08-16 DIAGNOSIS — R2681 Unsteadiness on feet: Secondary | ICD-10-CM

## 2020-08-16 DIAGNOSIS — R2689 Other abnormalities of gait and mobility: Secondary | ICD-10-CM

## 2020-08-16 DIAGNOSIS — I69354 Hemiplegia and hemiparesis following cerebral infarction affecting left non-dominant side: Secondary | ICD-10-CM

## 2020-08-16 NOTE — Patient Instructions (Addendum)
Access Code: [email or web address] URL: [email or web address] Date: 08/16/2020 Prepared by: Gustavus Bryant  Exercises Backward Walking with Counter Support - 2 x daily - 7 x weekly - 2 sets - 10 reps Tandem Walking with Counter Support - 2 x daily - 7 x weekly - 2 sets - 10 reps Tandem Stance with Eyes Closed and Head Rotation - 2 x daily - 7 x weekly - 2 sets - 5 reps Walking with Head Rotation - 2 x daily - 7 x weekly - 2 sets - 10 reps Carioca with Counter Support - 2 x daily - 7 x weekly - 2 sets - 10 reps  Access Code: [email or web address] URL: [email or web address] Date: 08/16/2020 Prepared by: Gustavus Bryant  Exercises Alternating Forward Lunge - 2 x daily - 7 x weekly - 3 sets - 10 reps - 30s hold Deep Water Jogging - 2 x daily - 7 x weekly - 3 sets - 10 reps - 30s hold Forward Backward Tandem Walking - 2 x daily - 7 x weekly - 3 sets - 10 reps - 30s hold Suspended Forward Bicycle Kick with Hand Floats - 2 x daily - 7 x weekly - 3 sets - 10 reps - 30s hold

## 2020-08-16 NOTE — Therapy (Signed)
[facility] [address] [place], [place], [address] Phone: [phone]   Fax:  [phone]  Physical Therapy Treatment/DC Summary  Patient Details  Name: Randy Robertson MRN: [id number] Date of Birth: November 22, 1950 Referring Provider (PT): Randy Beets MD   Encounter Date: 08/16/2020   PT End of Session - 08/16/20 1105     Visit Number 25    Number of Visits 25    Date for PT Re-Evaluation 08/09/20    Authorization Type [employer]-- 9 visits 7/10-9/10    Authorization - Visit Number 3    Authorization - Number of Visits 9    Progress Note Due on Visit 30    PT Start Time 1100    PT Stop Time 1145    PT Time Calculation (min) 45 min    Equipment Utilized During Treatment Gait belt    Activity Tolerance Patient tolerated treatment well    Behavior During Therapy [facility] for tasks assessed/performed             Past Medical History:  Diagnosis Date   Stroke (cerebrum) [facility])     Past Surgical History:  Procedure Laterality Date   implantable loop recorder placement  06/22/2020   Medtronic Reveal Linq model [id number] implantable loop recorder  (SN [id number])  for cryptogenic stroke   NO PAST SURGERIES      There were no vitals filed for this visit.   Subjective Assessment - 08/16/20 1104     Subjective Nothing new to report, no pain, feels he is 80% return to prior level of function.    Patient is accompained by: Interpreter   arrived 8-10 minutes into session.   Pertinent History Randy Robertson is a 70 y.o. male with no significant past medical history who presents 4/21 from [facility]  ER for stroke work-up due to left arm weakness and unsteady gait.  NAT:FTDDUKGURK acute ischemia within the right MCA territory.    Limitations Walking    Patient Stated Goals To walk better                                         PT Short Term Goals - 06/19/20 1028       PT SHORT TERM GOAL  #1   Title patient to demo initial HEP back to PT    Baseline 05/23/20 began 5x STS BID; 06/19/20 Patient able to demo HEP back w/o need of cuing    Time 4    Period Weeks    Status Achieved    Target Date 06/19/20      PT SHORT TERM GOAL #2   Title Assess BERG and establish appropriate goal    Baseline 05/23/20 BERG score 43, goal is 51    Time 4    Period Weeks    Status Achieved    Target Date 06/19/20      PT SHORT TERM GOAL #3   Title Assess FGA/DGI and set appropriate goal    Baseline FGA patially completed due to time constraints; 05/24/20 FGA score 13, goal is 21    Time 5    Period Weeks    Status Achieved    Target Date 06/19/20      PT SHORT TERM GOAL #4   Title Patient to ambulate 52f w/o need of AD with SBA across outdoor surfaces    Baseline 1024fin clinic under  SBA w/o need of AD; 06/19/20 Ambulation of 579f outside w/o ned of AD under S    Time 5    Period Weeks    Status Achieved    Target Date 06/19/20      PT SHORT TERM GOAL #5   Title Patient to negotiate full flight of stairs with most appropriate pattern and support    Baseline 4 steps with single rail and step to pattern; 06/19/20 Able to negotiate 16steps with B rails and step through pattern under S    Time 5    Period Weeks    Status Achieved    Target Date 06/19/20               PT Long Term Goals - 08/16/20 1139       PT LONG TERM GOAL #1   Title patient to demo final HEP back to PT    Baseline TBD; 07/03/20 HEP reviewed verbally, 08/16/20 Reviewed and returned demo.    Time 9    Period Weeks    Status Achieved      PT LONG TERM GOAL #2   Title Patient to increase FOTO score to 65    Baseline Initial FOTO score 54; FOTO score 75    Time 9    Period Weeks    Status Achieved      PT LONG TERM GOAL #3   Title Patient to demo gait velocity of 0.8 m/s w/o AD    Baseline 0.55 m/s w/o AD; 06/19/20 gait velocity 0.59 m/s; 07/05/20 gait velocity 0.73 m/s: 08/14/20 Gait speed 0.98 m/s     Time 9    Period Weeks    Status Achieved      PT LONG TERM GOAL #4   Title Patient to ambulate 10057facross outdoor surfaces w/o AD under S    Baseline 10072fcross leel indoor surface w/o AD under SBA; 07/05/20 1200f52fo AD under S across all outdoor surfaces    Time 9    Period Weeks    Status Achieved      PT LONG TERM GOAL #5   Title Patient to perform TUG w/o AD in 13s; 06/19/20 TUG w/o AD 15.4s; 07/05/20 TUG score 11.7s    Baseline Initial TUG w/o AD 19.02s    Time 9    Period Weeks    Status Achieved      PT LONG TERM GOAL #6   Title patient to perform 5x STS test w/o UE support in 15s    Baseline 5x STS score w/o UE assist 22.16s; 06/19/20 5x STS with UE support 21.1s; 07/05/20 5x STS 16.9s; 08/14/20 5x STS score 13.2s    Time 9    Period Weeks    Status Achieved                   Plan - 08/16/20 1106     Clinical Impression Statement All goals met, patient rates himself at 80% functional.  HEP reviewed and finalized.  All questions and concerns addressed.    Personal Factors and Comorbidities Comorbidity 1    Comorbidities CVA    Examination-Activity Limitations Locomotion Level;Stairs    Examination-Participation Restrictions Occupation    Stability/Clinical Decision Making Stable/Uncomplicated    Rehab Potential Good    PT Frequency 2x / week    PT Duration 4 weeks    PT Treatment/Interventions ADLs/Self Care Home Management;Aquatic Therapy;Gait training;Stair training;Functional mobility training;Therapeutic activities;Therapeutic exercise;Balance training;Neuromuscular re-education;Patient/family education    PT  Next Visit Plan FOTO and HEP prior to DC    PT Home Exercise Plan [email or web address]    Consulted and Agree with Plan of Care Patient             Patient will benefit from skilled therapeutic intervention in order to improve the following deficits and impairments:  Abnormal gait, Difficulty walking, Decreased endurance, Decreased activity tolerance,  Decreased balance, Decreased mobility, Decreased strength  Visit Diagnosis: Unsteadiness on feet  Other abnormalities of gait and mobility  Hemiplegia and hemiparesis following cerebral infarction affecting left non-dominant side [facility])     Problem List Patient Active Problem List   Diagnosis Date Noted   Stroke ([place]) 04/27/2020   Elevated blood-pressure reading without diagnosis of hypertension 04/27/2020   PHYSICAL THERAPY DISCHARGE SUMMARY  Visits from Start of Care: 25  Current functional level related to goals / functional outcomes: Goals met, patient rates himself at 80%   Remaining deficits: Trace dynamic balance deficits   Education / Equipment: HEP   Patient agrees to discharge. Patient goals were met. Patient is being discharged due to being pleased with the current functional level.   Lanice Shirts 08/16/2020, 2:17 PM  [facility] [address] [place], [place], [address] Phone: [phone]   Fax:  [phone]  Name: Randy Robertson MRN: [id number] Date of Birth: August 03, 1950

## 2020-08-16 NOTE — Progress Notes (Signed)
Carelink Summary Report / Loop Recorder 

## 2020-08-22 DIAGNOSIS — H547 Unspecified visual loss: Secondary | ICD-10-CM | POA: Diagnosis not present

## 2020-08-22 DIAGNOSIS — Z0001 Encounter for general adult medical examination with abnormal findings: Secondary | ICD-10-CM | POA: Diagnosis not present

## 2020-08-27 ENCOUNTER — Ambulatory Visit (INDEPENDENT_AMBULATORY_CARE_PROVIDER_SITE_OTHER): Payer: Medicare HMO

## 2020-08-27 DIAGNOSIS — I639 Cerebral infarction, unspecified: Secondary | ICD-10-CM | POA: Diagnosis not present

## 2020-08-27 LAB — CUP PACEART REMOTE DEVICE CHECK
Date Time Interrogation Session: 20220822093057
Implantable Pulse Generator Implant Date: 20220617

## 2020-08-28 ENCOUNTER — Ambulatory Visit: Payer: Medicare HMO | Admitting: Adult Health

## 2020-08-28 ENCOUNTER — Encounter: Payer: Self-pay | Admitting: Adult Health

## 2020-08-28 VITALS — BP 149/86 | HR 71 | Ht 71.0 in | Wt 249.6 lb

## 2020-08-28 DIAGNOSIS — I10 Essential (primary) hypertension: Secondary | ICD-10-CM

## 2020-08-28 DIAGNOSIS — I639 Cerebral infarction, unspecified: Secondary | ICD-10-CM | POA: Diagnosis not present

## 2020-08-28 DIAGNOSIS — E785 Hyperlipidemia, unspecified: Secondary | ICD-10-CM | POA: Diagnosis not present

## 2020-08-28 NOTE — Patient Instructions (Signed)
Plan to return back to work at the end of September at the completion of disability. Once you return back to work, recommend slowly returning back   Continue clopidogrel 75 mg daily  and atorvastatin 40mg  daily  for secondary stroke prevention  Continue to follow up with PCP regarding cholesterol and blood pressure management  Maintain strict control of hypertension with blood pressure goal below 130/90 and cholesterol with LDL cholesterol (bad cholesterol) goal below 70 mg/dL.       Followup in the future with me in 4 months or call earlier if needed       Thank you for coming to see at [facility]. I hope we have been able to provide you high quality care today.  You may receive a patient satisfaction survey over the next few weeks. We would appreciate your feedback and comments so that we may continue to improve ourselves and the health of our patients.

## 2020-08-28 NOTE — Progress Notes (Signed)
[facility] [address] [place]. [place] [address] [phone]       STROKE FOLLOW UP NOTE  [name] Date of Birth:  [birth year] Medical Record Number:  [id number]   Reason for Referral: stroke follow up    SUBJECTIVE:   CHIEF COMPLAINT:  Chief Complaint  Patient presents with   Stroke    Rm 3, 3 month FU, interpreter- Sanja  "feeling much better; still cannot walk far as I get tired quickly, lose balance"     HPI:   Today, 08/28/2020, Randy Robertson returns for stroke follow-up accompanied by [facility] interpreter. Overall doing well. Completed PT on 8/11 - continues to do HEP routinely. Does report some issues with balance/gait steadiness but overall greatly improved since prior visit. He remains on short term disability - he hopes to continue to improve and hopefully return back to work next month.  Will experience occasional left leg tingling upon awakening but will shortly resolve.  Continue decreased vision with evaluation with ophthalmology next week. He also reports continued decreased activity tolerance although gradually improving.  Denies new stroke/TIA symptoms. Compliant on plavix and atorvastatin 40mg  daily. Blood pressure today 149/86 - 130/140s/70s-80s at home on amlodipine 2.5mg  daily.  No further concerns at this time.   History provided for reference purposes Initial visit 06/26/2020 JM: Randy Robertson is being seen for hospital follow-up accompanied by his daughter who assists with interpreting.  Reports residual left-sided numbness/tingling, mild imbalance and blurred vision but overall greatly improving. Currently working with neuro rehab PT. he has not yet returned back to work as a [employer] currently on short-term disability.  Denies new stroke/TIA symptoms.  Has remained on both aspirin and Plavix as well as atorvastatin without associated side effects.  Blood pressure today 160/82.  Reports blood pressure has  been elevated since hospital discharge typically ranging 160s/90s.  He did not have any issues with blood pressure management prior to his stroke.  Plans on establishing care with new PCP on 7/21.  He was seen by Dr. 8/21 6/17 and had loop recorder placed.  No further concerns at this time.  Stroke admission 04/26/2020 [name] is a 70 y.o. male with history of prediabetes who presented on 04/26/2020 with left arm numbness from elbow to fingers, blurry vision, and unstable gait.  Personally reviewed hospitalization pertinent progress notes, lab work and imaging summary provided.  Stroke work-up reveals multifocal acute right MCA infarcts embolic secondary to unclear source.  Recommend placement of loop recorder outpatient to rule out cardioembolic source. 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  EF 60 to 65%.  LDL 132, A1c 5.7.  Recommended DAPT for 3 weeks and aspirin alone as well as adding atorvastatin 40 mg daily.  Evaluated by therapies who recommended outpatient PT and discharged home in stable condition.      ROS:   14 system review of systems performed and negative with exception of those listed in HPI  PMH:  Past Medical History:  Diagnosis Date   Stroke (cerebrum) (HCC)     PSH:  Past Surgical History:  Procedure Laterality Date   implantable loop recorder placement  06/22/2020   Medtronic Reveal Linq model 06/24/2020 implantable loop recorder  (SN [id number])  for cryptogenic stroke   NO PAST SURGERIES      Social History:  Social History   Socioeconomic History   Marital status: Married    Spouse name: Not on file   Number of children: Not on  file   Years of education: Not on file   Highest education level: Not on file  Occupational History   Not on file  Tobacco Use   Smoking status: Never   Smokeless tobacco: Never  Substance and Sexual Activity   Alcohol use: Never   Drug use: Never   Sexual activity: Not on file  Other Topics Concern   Not on file   Social History Narrative   Lives in [place]   Social Determinants of Health   Financial Resource Strain: Not on file  Food Insecurity: Not on file  Transportation Needs: Not on file  Physical Activity: Not on file  Stress: Not on file  Social Connections: Not on file  Intimate Partner Violence: Not on file    Family History:  Family History  Problem Relation Age of Onset   Hypertension Other     Medications:   Current Outpatient Medications on File Prior to Visit  Medication Sig Dispense Refill   amLODipine (NORVASC) 2.5 MG tablet Take 1 tablet (2.5 mg total) by mouth daily. 30 tablet 5   atorvastatin (LIPITOR) 40 MG tablet Take 1 tablet (40 mg total) by mouth daily. 90 tablet 3   clopidogrel (PLAVIX) 75 MG tablet Take 1 tablet (75 mg total) by mouth daily. 90 tablet 3   No current facility-administered medications on file prior to visit.    Allergies:  No Known Allergies    OBJECTIVE:  Physical Exam  Vitals:   08/28/20 1524  BP: (!) 149/86  Pulse: 71  Weight: 249 lb 9.6 oz (113.2 kg)  Height: 5\' 11"  (1.803 m)   Body mass index is 34.81 kg/m. No results found.   General: well developed, well nourished, very pleasant middle-age male, seated, in no evident distress Head: head normocephalic and atraumatic.   Neck: supple with no carotid or supraclavicular bruits Cardiovascular: regular rate and rhythm, no murmurs Musculoskeletal: no deformity Skin:  no rash/petichiae Vascular:  Normal pulses all extremities   Neurologic Exam Mental Status: Awake and fully alert.  Denies speech or language difficulties but difficulty fully assessing as his primary language is .  Oriented to place and time. Recent and remote memory intact. Attention span, concentration and fund of knowledge appropriate. Mood and affect appropriate.  Cranial Nerves: Pupils equal, briskly reactive to light. Extraocular movements full without nystagmus. Visual fields full to  confrontation. Hearing intact. Facial sensation intact. Face, tongue, palate moves normally and symmetrically.  Motor: Normal bulk and tone. Normal strength in all tested extremity muscles Sensory.:  Intact to light touch, vibratory and pinprick sensation Coordination: Rapid alternating movements normal in all extremities except slightly decreased left hand dexterity. Finger-to-nose and heel-to-shin mild left-sided dysmetria. Gait and Station: Arises from chair without difficulty. Stance is normal. Gait demonstrates normal stride length and mild imbalance and mild favoring of left leg without use of assistive device.  Able to perform tandem walk and heel toe with only slight difficulty Reflexes: 1+ and symmetric. Toes downgoing.        ASSESSMENT: Randy Robertson is a 70 y.o. year old male with recent multifocal acute right MCA infarcts, embolic secondary to unclear source on 04/26/2020 presenting with left arm numbness from elbow to fingers, blurred vision and gait impairment.  Loop recorder placed 06/22/2020 by Dr. 06/24/2020.  Vascular risk factors include HLD and advanced age.      PLAN:  R MCA stroke, cryptogenic:  Residual deficit: Imbalance/gait impairment, occasional left leg numbness and decreased visual acuity.  Advised  continued HEP for hopeful further recovery.  He is hopeful to return back to work at the end of next month. Will ensure STD is approved through that time.  Evaluation with ophthalmology next week for decreased visual acuity S/p ILR 06/22/2020 - no evidence of atrial fibrillation thus far Continue Plavix and atorvastatin 40 mg daily for secondary stroke prevention.    Discussed secondary stroke prevention measures and importance of close PCP follow up for aggressive stroke risk factor management  HTN: BP goal <130/90.  Well-controlled on amlodipine 2.5 mg daily -request ongoing monitoring management per PCP HLD: LDL goal <70. Recent LDL 69 (down from 32) -currently on  atorvastatin 40 mg daily per PCP    Follow up in 4 months or call earlier if needed   CC:  [facility] provider: Dr. Lonia Mad, Anselm Pancoast, PA-C    I spent 36 minutes of face-to-face and non-face-to-face time with patient assisted by interpreter.  This included previsit chart review, lab review, study review, electronic health record documentation, and patient education and discussion regarding stroke and potential etiologies as well as secondary stroke prevention measures and, importance of managing stroke risk factors, residual deficits and likely further recovery and answered all other questions to patients satisfaction  Ihor Austin, AGNP-BC  [facility] [address] [place], [place] [address]  Phone [phone] Fax [phone] Note: This document was prepared with digital dictation and possible smart phrase technology. Any transcriptional errors that result from this process are unintentional.

## 2020-08-29 NOTE — Progress Notes (Signed)
I agree with the above plan 

## 2020-09-12 NOTE — Progress Notes (Signed)
Carelink Summary Report / Loop Recorder 

## 2020-09-24 ENCOUNTER — Telehealth: Payer: Self-pay | Admitting: Adult Health

## 2020-09-24 NOTE — Telephone Encounter (Signed)
Pt's daughter states pt's employer wants to know if pt has restrictions re: returning to work, please advise daughter if a letter can be prepared for employer.

## 2020-09-24 NOTE — Telephone Encounter (Signed)
I would recommend for at least the first week

## 2020-09-24 NOTE — Telephone Encounter (Signed)
Contacted daughter back, they are agreeable to your plan but need to be specific 1 week or 2 due to job questioning it.

## 2020-09-24 NOTE — Telephone Encounter (Signed)
Pt's daughter returned phone call, would like a call back. 

## 2020-09-24 NOTE — Telephone Encounter (Signed)
Suggest any restrictions ?

## 2020-09-24 NOTE — Telephone Encounter (Signed)
Contacted daughter back, LVM asking her to return call

## 2020-09-27 ENCOUNTER — Ambulatory Visit: Payer: Medicare HMO | Admitting: Adult Health

## 2020-10-01 ENCOUNTER — Ambulatory Visit (INDEPENDENT_AMBULATORY_CARE_PROVIDER_SITE_OTHER): Payer: Medicare HMO

## 2020-10-01 DIAGNOSIS — I639 Cerebral infarction, unspecified: Secondary | ICD-10-CM

## 2020-10-01 LAB — CUP PACEART REMOTE DEVICE CHECK
Date Time Interrogation Session: 20220924093207
Implantable Pulse Generator Implant Date: 20220617

## 2020-10-08 NOTE — Telephone Encounter (Signed)
Received Attending Physicians Statement from [place]. Per note NP advised return for 1/2 day without restrictions for 1-2 weeks. Daughter stated dates need to be specific. On NP's desk for completion, signature.

## 2020-10-08 NOTE — Telephone Encounter (Signed)
Completed and placed in out box.  Thank you. 

## 2020-10-08 NOTE — Progress Notes (Signed)
Carelink Summary Report / Loop Recorder 

## 2020-10-09 ENCOUNTER — Telehealth: Payer: Self-pay | Admitting: *Deleted

## 2020-10-09 NOTE — Telephone Encounter (Signed)
Called daughter Maryan Puls and advised [employer] papers are completed. She stated that patient returned to work full time with no restrictions on 08/31/20 and that date needed to be on papers. Date entered on page 1 "projected full time return to work date".  She asked for papers to be faxed and she will pick up copy later this week. I advised will fax and put copy at front desk. Patient's daughter verbalized understanding, appreciation. Papers faxed to [facility], and then sent to medical records. Confirmation of fax received.  Copy at front desk.

## 2020-10-09 NOTE — Telephone Encounter (Signed)
Pt [employer] form faxed on 10/08/20.

## 2020-10-31 ENCOUNTER — Telehealth: Payer: Self-pay | Admitting: *Deleted

## 2020-10-31 NOTE — Telephone Encounter (Signed)
Received [employer] attending physicians statement with request to change return to work date from 08/28/2020 to 10/01/2020. Form placed on NP desk for completion.

## 2020-11-01 LAB — CUP PACEART REMOTE DEVICE CHECK
Date Time Interrogation Session: 20221027093211
Implantable Pulse Generator Implant Date: 20220617

## 2020-11-05 ENCOUNTER — Ambulatory Visit (INDEPENDENT_AMBULATORY_CARE_PROVIDER_SITE_OTHER): Payer: Medicare HMO

## 2020-11-05 DIAGNOSIS — I639 Cerebral infarction, unspecified: Secondary | ICD-10-CM | POA: Diagnosis not present

## 2020-11-05 NOTE — Telephone Encounter (Signed)
Completed and placed in out box.  Thank you. 

## 2020-11-06 NOTE — Telephone Encounter (Signed)
[employer] attending physician statement completed by Shanda Bumps NP, sent to medical records for processing.

## 2020-11-12 ENCOUNTER — Telehealth: Payer: Self-pay | Admitting: Adult Health

## 2020-11-12 NOTE — Telephone Encounter (Signed)
Patient's [employer] forms faxed to [phone]

## 2020-11-13 NOTE — Progress Notes (Signed)
Carelink Summary Report / Loop Recorder 

## 2020-12-05 LAB — CUP PACEART REMOTE DEVICE CHECK
Date Time Interrogation Session: 20221129083642
Implantable Pulse Generator Implant Date: 20220617

## 2020-12-10 ENCOUNTER — Ambulatory Visit (INDEPENDENT_AMBULATORY_CARE_PROVIDER_SITE_OTHER): Payer: Medicare HMO

## 2020-12-10 DIAGNOSIS — I639 Cerebral infarction, unspecified: Secondary | ICD-10-CM

## 2020-12-10 DIAGNOSIS — U071 COVID-19: Secondary | ICD-10-CM | POA: Diagnosis not present

## 2020-12-10 DIAGNOSIS — J069 Acute upper respiratory infection, unspecified: Secondary | ICD-10-CM | POA: Diagnosis not present

## 2020-12-10 DIAGNOSIS — J029 Acute pharyngitis, unspecified: Secondary | ICD-10-CM | POA: Diagnosis not present

## 2020-12-11 ENCOUNTER — Telehealth: Payer: Self-pay | Admitting: *Deleted

## 2020-12-11 DIAGNOSIS — U071 COVID-19: Secondary | ICD-10-CM

## 2020-12-11 NOTE — Telephone Encounter (Signed)
HR asked clinic to f/u with pt. RN called pt and he informed RN that his doctor told him this morning that he has tested positive for covid. Limited details due to language barrier but pt reports leaving work early Saturday 12/3 due to not feeling well and having fever, chills. Saw a provider today or yesterday that tested him and he received results today. He reports feeling well now today and denies any current sx. Doctor told him he could RTW Friday. Based on Day 0 sx start date 12/3, RN agrees with this. Day 5 12/8, RTW 12/9 with strict mask use, including not eating in the employee break room, thru Day 10 12/13. Pt verbalizes agreement and understanding by repeating back plan for RTW. Denies any further needs or concerns.

## 2020-12-12 ENCOUNTER — Encounter: Payer: Self-pay | Admitting: *Deleted

## 2020-12-12 NOTE — Telephone Encounter (Signed)
Daughter answered phone stated patient doing well and taking his paxlovid.  No questions or concerns at this time.  Discussed patient can contact me at [phone] if questions or concerns.  Daughter verbalized understanding information and had no further questions at this time.

## 2020-12-13 NOTE — Telephone Encounter (Signed)
Reviewed RN Rolly Salter note worsening symptoms on paxlovid will follow up with patient via telephone this weekend.  Agreed with plan of care.

## 2020-12-13 NOTE — Telephone Encounter (Signed)
Spoke with daughter Maryan Puls. She asked for next update phone call to come to her and she can help talk to pt. [phone]) She sts she was feeling very well this week but since last night has been feeling tired, weak. Does not think he will make it through shift tomorrow. Not scheduled this weekend. Will keep out 12/9 due to worsening sx and re-eval over the weekend for possible RTW 12/12.

## 2020-12-15 NOTE — Telephone Encounter (Signed)
Daughter Maryan Puls stated symptoms improving saw PCM and started on new medication a couple days ago.  Will follow up with them again tomorrow for re-evaluation/readiness to return to work onsite 12/17/20.  HR notified re-evaluation 12/16/20.  If patient has any questions or concerns may contact me at [phone] this weekend.  Daughter verbalized understanding information/instructions, agreed with plan of care and had no further questions at this time.

## 2020-12-16 NOTE — Telephone Encounter (Signed)
Spoke with daughter patient feeling better having some muscle aches but ready to return to work tomorrow 12.12.22.  Cleared to return onsite.  Took OTC pain medicine and symptoms improved.  Discussed strict mask wear and no eating in employee lunch room through 12/18/20.  RN Rolly Salter in clinic tomorrow if patient needs re-evaluation or has questions. HR notified. Daughter verbalized understanding information/instructions, agreed with plan of care and had no further questions at this time.

## 2020-12-17 NOTE — Telephone Encounter (Signed)
Per HR Tonya patient returned to work today.

## 2020-12-19 NOTE — Progress Notes (Signed)
Carelink Summary Report / Loop Recorder 

## 2020-12-21 DIAGNOSIS — I6990 Unspecified sequelae of unspecified cerebrovascular disease: Secondary | ICD-10-CM | POA: Diagnosis not present

## 2020-12-21 DIAGNOSIS — Z0000 Encounter for general adult medical examination without abnormal findings: Secondary | ICD-10-CM | POA: Diagnosis not present

## 2020-12-21 DIAGNOSIS — I10 Essential (primary) hypertension: Secondary | ICD-10-CM | POA: Diagnosis not present

## 2020-12-21 DIAGNOSIS — Z7189 Other specified counseling: Secondary | ICD-10-CM | POA: Diagnosis not present

## 2020-12-21 DIAGNOSIS — E7849 Other hyperlipidemia: Secondary | ICD-10-CM | POA: Diagnosis not present

## 2020-12-23 NOTE — Telephone Encounter (Signed)
Day 10 12/13  Daughter stated father worked every day this week denied questions or concerns and feeling well.

## 2021-01-14 ENCOUNTER — Ambulatory Visit (INDEPENDENT_AMBULATORY_CARE_PROVIDER_SITE_OTHER): Payer: Medicare HMO

## 2021-01-14 DIAGNOSIS — I639 Cerebral infarction, unspecified: Secondary | ICD-10-CM

## 2021-01-14 LAB — CUP PACEART REMOTE DEVICE CHECK
Date Time Interrogation Session: 20230108233542
Implantable Pulse Generator Implant Date: 20220617

## 2021-01-15 ENCOUNTER — Ambulatory Visit: Payer: Medicare HMO | Admitting: Adult Health

## 2021-01-22 NOTE — Progress Notes (Signed)
Carelink Summary Report / Loop Recorder 

## 2021-01-26 DIAGNOSIS — H35372 Puckering of macula, left eye: Secondary | ICD-10-CM | POA: Diagnosis not present

## 2021-02-16 LAB — CUP PACEART REMOTE DEVICE CHECK
Date Time Interrogation Session: 20230210233349
Implantable Pulse Generator Implant Date: 20220617

## 2021-02-18 ENCOUNTER — Ambulatory Visit (INDEPENDENT_AMBULATORY_CARE_PROVIDER_SITE_OTHER): Payer: Medicare HMO

## 2021-02-18 DIAGNOSIS — I639 Cerebral infarction, unspecified: Secondary | ICD-10-CM

## 2021-02-20 NOTE — Progress Notes (Signed)
Carelink Summary Report / Loop Recorder 

## 2021-03-25 ENCOUNTER — Ambulatory Visit (INDEPENDENT_AMBULATORY_CARE_PROVIDER_SITE_OTHER): Payer: Medicare HMO

## 2021-03-25 DIAGNOSIS — I639 Cerebral infarction, unspecified: Secondary | ICD-10-CM

## 2021-03-25 LAB — CUP PACEART REMOTE DEVICE CHECK
Date Time Interrogation Session: 20230319232014
Implantable Pulse Generator Implant Date: 20220617

## 2021-04-03 NOTE — Progress Notes (Signed)
Carelink Summary Report / Loop Recorder 

## 2021-04-29 ENCOUNTER — Ambulatory Visit (INDEPENDENT_AMBULATORY_CARE_PROVIDER_SITE_OTHER): Payer: Medicare HMO

## 2021-04-29 DIAGNOSIS — I639 Cerebral infarction, unspecified: Secondary | ICD-10-CM | POA: Diagnosis not present

## 2021-04-29 LAB — CUP PACEART REMOTE DEVICE CHECK
Date Time Interrogation Session: 20230421231616
Implantable Pulse Generator Implant Date: 20220617

## 2021-05-15 NOTE — Progress Notes (Signed)
Carelink Summary Report / Loop Recorder 

## 2021-05-30 ENCOUNTER — Ambulatory Visit (INDEPENDENT_AMBULATORY_CARE_PROVIDER_SITE_OTHER): Payer: Medicare HMO

## 2021-05-30 DIAGNOSIS — I639 Cerebral infarction, unspecified: Secondary | ICD-10-CM

## 2021-05-30 LAB — CUP PACEART REMOTE DEVICE CHECK
Date Time Interrogation Session: 20230524235723
Implantable Pulse Generator Implant Date: 20220617

## 2021-06-11 NOTE — Progress Notes (Signed)
Carelink Summary Report / Loop Recorder 

## 2021-06-24 ENCOUNTER — Other Ambulatory Visit: Payer: Self-pay

## 2021-06-24 MED ORDER — CLOPIDOGREL BISULFATE 75 MG PO TABS: 75.0000 mg | ORAL_TABLET | Freq: Every day | ORAL | 0 refills | Status: AC

## 2021-06-24 MED ORDER — ATORVASTATIN CALCIUM 40 MG PO TABS: 40.0000 mg | ORAL_TABLET | Freq: Every day | ORAL | 0 refills | Status: AC

## 2021-07-02 ENCOUNTER — Ambulatory Visit (INDEPENDENT_AMBULATORY_CARE_PROVIDER_SITE_OTHER): Payer: Medicare HMO

## 2021-07-02 DIAGNOSIS — I639 Cerebral infarction, unspecified: Secondary | ICD-10-CM | POA: Diagnosis not present

## 2021-07-02 LAB — CUP PACEART REMOTE DEVICE CHECK
Date Time Interrogation Session: 20230626232836
Implantable Pulse Generator Implant Date: 20220617

## 2021-07-24 NOTE — Progress Notes (Signed)
Carelink Summary Report / Loop Recorder 

## 2021-08-05 ENCOUNTER — Ambulatory Visit (INDEPENDENT_AMBULATORY_CARE_PROVIDER_SITE_OTHER): Payer: Medicare HMO

## 2021-08-05 DIAGNOSIS — I639 Cerebral infarction, unspecified: Secondary | ICD-10-CM

## 2021-08-05 LAB — CUP PACEART REMOTE DEVICE CHECK
Date Time Interrogation Session: 20230729232749
Implantable Pulse Generator Implant Date: 20220617

## 2021-08-12 ENCOUNTER — Other Ambulatory Visit: Payer: Self-pay

## 2021-08-12 MED ORDER — ATORVASTATIN CALCIUM 40 MG PO TABS: 40.0000 mg | ORAL_TABLET | Freq: Every day | ORAL | 3 refills | Status: AC

## 2021-09-05 NOTE — Progress Notes (Signed)
Carelink Summary Report / Loop Recorder 

## 2021-09-09 LAB — CUP PACEART REMOTE DEVICE CHECK
Date Time Interrogation Session: 20230831234903
Implantable Pulse Generator Implant Date: 20220617

## 2021-09-10 ENCOUNTER — Ambulatory Visit (INDEPENDENT_AMBULATORY_CARE_PROVIDER_SITE_OTHER): Payer: Medicare HMO

## 2021-09-10 DIAGNOSIS — I639 Cerebral infarction, unspecified: Secondary | ICD-10-CM | POA: Diagnosis not present

## 2021-10-02 NOTE — Progress Notes (Signed)
Carelink Summary Report / Loop Recorder 

## 2021-10-09 LAB — CUP PACEART REMOTE DEVICE CHECK
Date Time Interrogation Session: 20231003233743
Implantable Pulse Generator Implant Date: 20220617

## 2021-10-14 ENCOUNTER — Ambulatory Visit (INDEPENDENT_AMBULATORY_CARE_PROVIDER_SITE_OTHER): Payer: Medicare HMO

## 2021-10-14 DIAGNOSIS — I639 Cerebral infarction, unspecified: Secondary | ICD-10-CM | POA: Diagnosis not present

## 2021-10-23 NOTE — Progress Notes (Signed)
Carelink Summary Report / Loop Recorder 

## 2021-11-18 ENCOUNTER — Ambulatory Visit (INDEPENDENT_AMBULATORY_CARE_PROVIDER_SITE_OTHER): Payer: Medicare HMO

## 2021-11-18 DIAGNOSIS — I639 Cerebral infarction, unspecified: Secondary | ICD-10-CM | POA: Diagnosis not present

## 2021-11-19 LAB — CUP PACEART REMOTE DEVICE CHECK
Date Time Interrogation Session: 20231112233044
Implantable Pulse Generator Implant Date: 20220617

## 2021-12-23 ENCOUNTER — Ambulatory Visit (INDEPENDENT_AMBULATORY_CARE_PROVIDER_SITE_OTHER): Payer: Medicare HMO

## 2021-12-23 DIAGNOSIS — I639 Cerebral infarction, unspecified: Secondary | ICD-10-CM | POA: Diagnosis not present

## 2021-12-24 LAB — CUP PACEART REMOTE DEVICE CHECK
Date Time Interrogation Session: 20231217232529
Implantable Pulse Generator Implant Date: 20220617

## 2021-12-31 NOTE — Progress Notes (Signed)
Carelink Summary Report / Loop Recorder 

## 2022-01-27 ENCOUNTER — Ambulatory Visit: Payer: Medicare HMO | Attending: Cardiology

## 2022-01-27 DIAGNOSIS — I639 Cerebral infarction, unspecified: Secondary | ICD-10-CM

## 2022-01-27 NOTE — Progress Notes (Signed)
Carelink Summary Report / Loop Recorder

## 2022-01-28 LAB — CUP PACEART REMOTE DEVICE CHECK
Date Time Interrogation Session: 20240119233046
Implantable Pulse Generator Implant Date: 20220617

## 2022-02-28 ENCOUNTER — Encounter: Payer: Self-pay | Admitting: Registered Nurse

## 2022-02-28 ENCOUNTER — Telehealth: Payer: Self-pay | Admitting: Registered Nurse

## 2022-02-28 DIAGNOSIS — Z299 Encounter for prophylactic measures, unspecified: Secondary | ICD-10-CM

## 2022-02-28 NOTE — Telephone Encounter (Signed)
Patient had labs at [facility] 11/2021  LDL and A1c met Be Well requirements please complete paperwork with patient. Met requireemtns.  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   Ref Range & Units 3 mo ago Comments   HX HEMOGLOBIN A1C <5.7 % 6.2 High  Normal:       Less than 5.7% Prediabetes:  5.7% to 6.4% Diabetes:     Greater than 6.4%  HX ESTIMATED AVERAGE GLUCOSE MG/DL 131   HX EAG COMMENT   The ADA has supported the calculation of Average Glucose (eAG) based on HbA1c measurements. However, the eAG reflects the average glycemic level over 2-3 months and it would not necessarily match single glucose laboratory measurements, nor reflect changes in the daily glucose concentration.  Resulting Agency  [facility]   Narrative Performed by [facility] Performing Lab: [place] CLIA# [id number], [place], [place]. [address] Specimen Type: Blood Specimen Collected: 11/07/21 15:28   Performed by: V[facility] Last Resulted: 11/07/21 19:43  Received From: [facility]  Result Received: 02/19/22 12:54    HX CHOLESTEROL 25 - 199 MG/DL 118   HX TRIGLYCERIDES 10 - 150 MG/DL 164 High    HX HDL CHOLESTEROL 35 - 135 MG/DL 38   HX LDL CHOLESTEROL CALCULATED 0 - 99 MG/DL 47   HX TOTAL CHOL/HDL CHOLESTEROL <4.5 3.1   HX NON HDL CHOLESTEROL MG/DL 80  TARGET: <(LDL-C TARGET + 30)MG/DL  HX CORONARY HEART DISEASE RISK TABLE   TOTAL CHOLESTEROL/HDL RATIO:CHD RISK CORONARY HEART DISEASE RISK TABLE                      MEN     WOMEN 1/2 AVERAGE RISK    3.4      3.3 AVERAGE RISK        5.8      4.4 2 X AVERAGE RISK    9.6      7.1 3 X AVERAGE RISK    23.4     11.0  Use the calculated Patient Ratio and the CHD Risk Table to determine the patient's CHD Risk.  ATP III Classification (LDL): <100       mg/dl     Optimal 100-129    mg/dl     Near or Above Optimal 130-159    mg/dl     Borderline 160-189    mg/dl     High >190        mg/dl     Very High  Resulting Agency  [facility]   Narrative Performed by [place] Performing Lab: [facility] CLIA# [id number], [place], [place]. [address] Specimen Type: Blood Specimen Collected: 11/07/21 15:28   Performed by: [place]: 11/07/21 19:10  Received From: [facility]  Result Received: 02/19/22 12:54     Ref Range & Units 3 mo ago Comments   HX PROSTATE SPECIFIC AG 0.00 - 4.00 NG/ML 1.81 Results are obtained using Beckman Coulter's DxI600, which uses chemiluminescent immunoassay.  Patient results determined by assays using a different manufacturer and/or method may not be comparable.  Resulting Agency  [facility]   Narrative Performed by [place] Performing Lab: [facility] CLIA# [id number], [place], [place]. [address] Specimen Type: Blood Specimen Collected: 11/07/21 15:28   Performed by: [facility] Last Resulted: 11/07/21 19:21  Received From: [place]  Result Received: 02/19/22 12:54  Ref Range & Units 3 mo ago Comments   HX SODIUM 135 - 146 MMOL/L 140   HX POTASSIUM 3.5 - 5.3 MMOL/L 3.8   HX CHLORIDE 98 - 110 MMOL/L 103   HX CO2 23 - 30 MMOL/L 32 High    HX BUN 8 - 24 MG/DL 14   HX GLUCOSE 70 - 99 MG/DL 89 Patients taking eltrombopag at doses >/= 100 mg daily may show falsely elevated values of 10% or greater.  HX CREATININE 0.50 - 1.50 MG/DL 0.72   HX CALCIUM 8.5 - 10.5 MG/DL 9.6   HX TOTAL PROTEIN 6.0 - 8.3 G/DL 7.0 Patients taking eltrombopag at doses >/= 100 mg daily may show falsely elevated values of 10% or greater.  HX ALBUMIN 3.5 - 5.0 G/DL 4.5   HX BILIRUBIN TOTAL 0.1 - 1.2 MG/DL 1.1 Patients taking eltrombopag at doses >/= 100 mg daily may show falsely elevated values of 10% or greater.  HX ALKALINE PHOSPHATASE 25 - 125 IU/L or U/L 66   HX AST (SGOT) 5 - 40 IU/L or U/L 16   HX ALT (SGPT) 5 - 50 IU/L or U/L 21   HX ANION GAP (WC) 4 - 14  MMOL/L 5   HX EST. GFR >=60 ML/MIN/1.73 M*2 >90 GFR estimated by CKD-EPI equations(NKF 2021).  "Recommend confirmation of Cr-based eGFR by using Cys-based eGFR and other filtration markers (if applicable) in complex cases and clinical decision-making, as needed."  [place]   Narrative Performed by [place]: [facility] CLIA# [id number], [place], [place]. [address] Specimen Type: Blood Specimen Collected: 11/07/21 15:28   Performed by: [place]: 11/07/21 19:10  Received From: [facility]  Result Received: 02/19/22 12:54  BP 143/81 12/09/21 lasix was added to his regimen recheck BP in clinic. Cancel [facility] appt 03/06/22 as vitamin D normal in the past year also along with B12 unless he has [employer] and wants microalbumin and Hgba1c quarterly performed.

## 2022-03-03 ENCOUNTER — Ambulatory Visit: Payer: Medicare HMO

## 2022-03-03 DIAGNOSIS — I639 Cerebral infarction, unspecified: Secondary | ICD-10-CM

## 2022-03-04 LAB — CUP PACEART REMOTE DEVICE CHECK
Date Time Interrogation Session: 20240221233335
Implantable Pulse Generator Implant Date: 20220617

## 2022-03-14 NOTE — Progress Notes (Signed)
Carelink Summary Report / Loop Recorder 

## 2022-03-18 NOTE — Telephone Encounter (Signed)
Patient on [facility] insurance no Be Well appt required for 2025 RN Kimrey notified not eligible for routine care at [facility].  Injury care only.

## 2022-04-07 ENCOUNTER — Ambulatory Visit: Payer: Medicare HMO | Attending: Cardiology

## 2022-04-07 DIAGNOSIS — I639 Cerebral infarction, unspecified: Secondary | ICD-10-CM | POA: Diagnosis not present

## 2022-04-08 LAB — CUP PACEART REMOTE DEVICE CHECK
Date Time Interrogation Session: 20240331230332
Implantable Pulse Generator Implant Date: 20220617

## 2022-04-11 NOTE — Progress Notes (Signed)
Carelink Summary Report / Loop Recorder 

## 2022-05-07 ENCOUNTER — Telehealth: Payer: Self-pay | Admitting: *Deleted

## 2022-05-07 NOTE — Telephone Encounter (Signed)
   Name: Randy Robertson  DOB: 12-30-1950  MRN: [id number]  Primary Cardiologist: None  Chart reviewed as part of pre-operative protocol coverage. Because of [name]'s past medical history and time since last visit, he will require a follow-up in-office visit in order to better assess preoperative cardiovascular risk.  Pre-op covering staff: - Please schedule appointment and call patient to inform them. If patient already had an upcoming appointment within acceptable timeframe, please add "pre-op clearance" to the appointment notes so provider is aware. - Please contact requesting surgeon's office via preferred method (i.e, phone, fax) to inform them of need for appointment prior to surgery.    Napoleon Form, Leodis Rains, NP  05/07/2022, 9:26 PM

## 2022-05-07 NOTE — Telephone Encounter (Signed)
   Pre-operative Risk Assessment    Patient Name: Randy Robertson  DOB: [birth year] MRN: [id number]      Request for Surgical Clearance    Procedure:   COLONOSCOPY  Date of Surgery:  Clearance TBD                                 Surgeon:  DR. BADREDDINE Surgeon's Group or Practice Name:  [facility] Phone number:  [phone] Fax number:  [phone]   Type of Clearance Requested:   - Medical  - Pharmacy:  Hold Clopidogrel (Plavix) x 5-7 DAYS PRIOR   Type of Anesthesia:  Not Indicated (PROPOFOL?)   Additional requests/questions:    Elpidio Anis   05/07/2022, 6:05 PM

## 2022-05-08 NOTE — Telephone Encounter (Signed)
Looks to be that the pt is followed by EP, was Dr. Johney Frame. Pt will need in office appt per pre op APP today. I will send a message to [place], EP scheduler today to call the pt with an appt for pre op appt.

## 2022-05-09 NOTE — Telephone Encounter (Signed)
Lenor Coffin, RN  [place], [place]; [place], Roger Shelter, CMA That is correct [place]. If they have AF that showed on report, we will have them come in office of AF clinic. Otherwise, we only follow them remotely. We do not require clearance from ILR's as there are know magnet function like pacemakers and ICD's for reprogramming. So no clearance is needed for pre-op or MRI. If MRI requires some kind of documentation we can provide a general letter to them but typically they are aware.  Thanks, Marliss Czar, RN       Previous Messages    ----- Message ----- From: Elyse Hsu Sent: 05/08/2022   4:10 PM EDT To: [facility] Subject: FW: NEEDS EP APPT PRE OP                      I got this request for this pt to be cleared for a procedure. Looks like he has an ILR for stroke so we only follow him remotely.  We wouldn't see him right? ----- Message ----- From: Tarri Fuller, CMA Sent: 05/08/2022   4:02 PM EDT To: Elyse Hsu Subject: NEEDS EP APPT PRE OP                          Looks to be that the pt is followed by EP, was Dr. Johney Frame. Pt will need in office appt per pre op APP today. I will send a message to [place], EP scheduler today to call the pt with an appt for pre op appt.  Thank you Okey Regal

## 2022-05-12 ENCOUNTER — Ambulatory Visit (INDEPENDENT_AMBULATORY_CARE_PROVIDER_SITE_OTHER): Payer: Medicare HMO

## 2022-05-12 DIAGNOSIS — I639 Cerebral infarction, unspecified: Secondary | ICD-10-CM | POA: Diagnosis not present

## 2022-05-12 LAB — CUP PACEART REMOTE DEVICE CHECK
Date Time Interrogation Session: 20240503230505
Implantable Pulse Generator Implant Date: 20220617

## 2022-05-12 NOTE — Telephone Encounter (Signed)
I d/w the pre op APP for for further recommendations. Our practice only follows this pt remotely for the loop recorder. Per pre op APP clearance will need to come from Neuro or PCP ( Risk assessment should come from neuro or PCP.) I will update the requesting office.

## 2022-05-12 NOTE — Telephone Encounter (Signed)
I called Huntley Dec back who had a question about the notes we sent earlier today. I explained to Huntley Dec that we only follow the pt for a loop recorder that Dr. Johney Frame had implanted in 2022. I did inform that we also do not follow his Plavix. Plavix looks to be followed by PCP.  Huntley Dec said she will follow up with PCP and thanked me for the help.

## 2022-05-13 NOTE — Progress Notes (Signed)
Carelink Summary Report / Loop Recorder 

## 2022-06-08 IMAGING — CT CT ANGIO NECK
2 of 11 series · 6 of 35 positions shown · IV contrast (Omnipaque)
Comparison: No pertinent prior exams available for comparison.

CLINICAL DATA: Neuro deficit, acute, stroke suspected. Additional
history provided: Patient reports left arm numbness from elbow to
left fingers starting this morning, blurred vision, patient's
daughter reports unstable gait, unknown last normal.

EXAM:
CT ANGIOGRAPHY HEAD AND NECK
TECHNIQUE: Multidetector CT imaging of the head and neck was performed using
the standard protocol during bolus administration of intravenous
contrast. Multiplanar CT image reconstructions and MIPs were
obtained to evaluate the vascular anatomy. Carotid stenosis
measurements (when applicable) are obtained utilizing NASCET
criteria, using the distal internal carotid diameter as the
denominator.
CONTRAST:  100mL OMNIPAQUE IOHEXOL 350 MG/ML SOLN

[Series 12: axial thin · axial · 0.48mm/px · z∈[-491,-254]mm · 5 of 357 slices shown]
[im 60/357  soft-tissue]
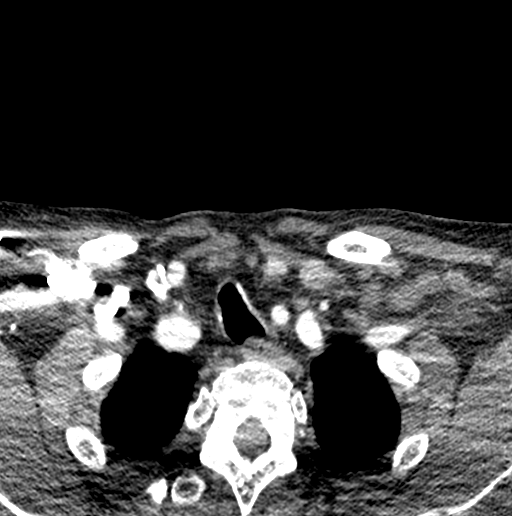
[im 119/357  bone]
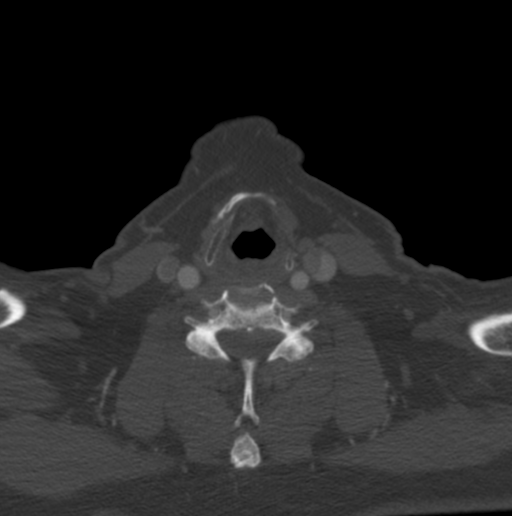
[im 179/357  soft-tissue]
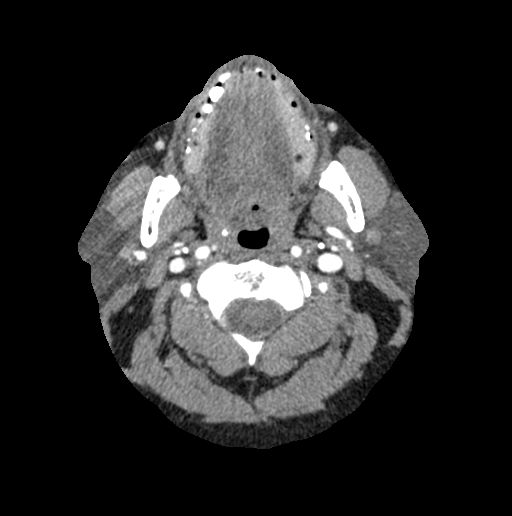
[im 238/357  bone]
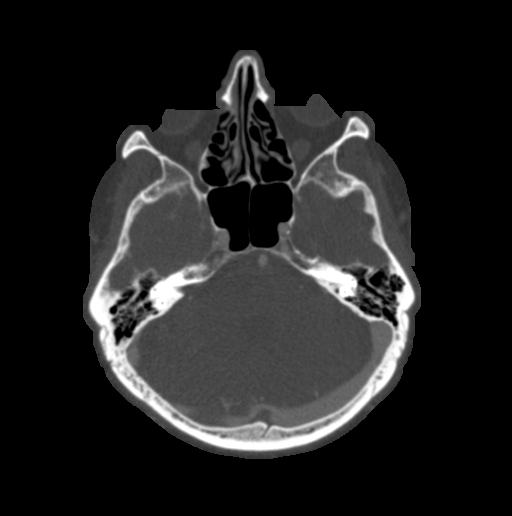
[im 297/357  soft-tissue]
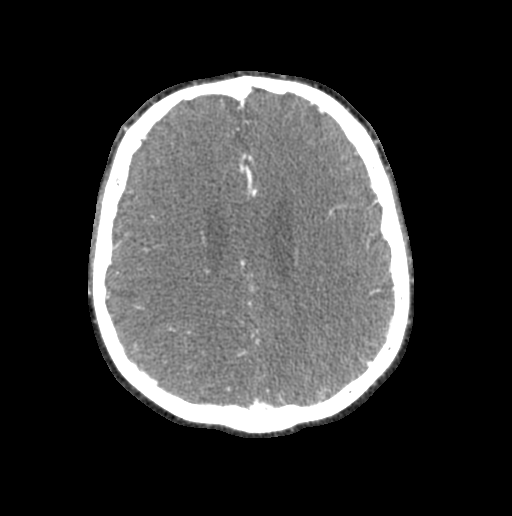

[Series 14: sagittal thin · sagittal · 0.62mm/px · 1 of 260 slices shown]
[im 103/260  soft-tissue]
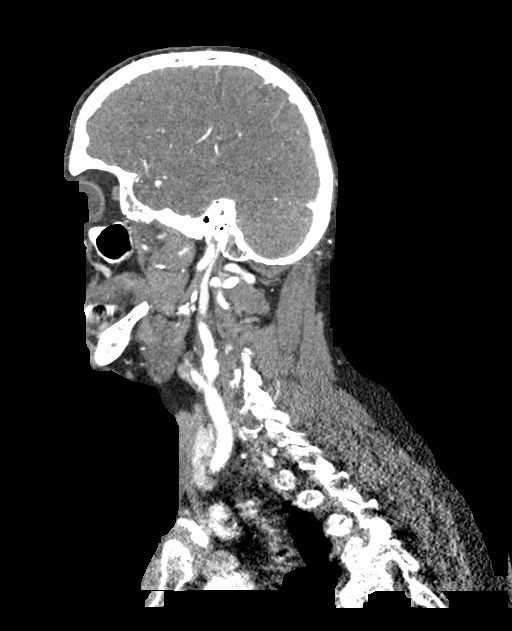

[6 of 35 positions shown; findings below may reference images not displayed]

FINDINGS: CT HEAD FINDINGS

Brain:

Mild cerebral atrophy.

Mild ill-defined hypoattenuation within the cerebral white matter is
nonspecific, but compatible with chronic small vessel ischemic
disease.

A small acute cortical/subcortical infarct (measuring approximately
10 mm) is questioned within the mid to posterior right frontal lobe
(for instance as seen on series 4, image 25) (series 8, images 49
and 50).

There is no acute intracranial hemorrhage.

No extra-axial fluid collection.

No evidence of intracranial mass.

No midline shift.

Vascular: No hyperdense vessel.  Atherosclerotic calcifications

Skull: Normal. Negative for fracture or focal lesion.

Sinuses: No significant paranasal sinus disease at the imaged
levels.

Orbits: No mass or acute finding

Review of the MIP images confirms the above findings

CTA NECK FINDINGS

Aortic arch: Standard aortic branching. Atherosclerotic plaque
within the visualized aortic arch and proximal major branch vessels
of the neck. No hemodynamically significant innominate or proximal
subclavian artery stenosis.

Right carotid system: CCA and ICA patent within the neck. Mild soft
and calcified plaque within the carotid bifurcation and proximal
ICA. Resultant less than 50% stenosis within the proximal ICA.
Tortuosity of the cervical ICA.

Left carotid system: CCA and ICA patent within the neck without
stenosis. Mild soft and calcified plaque within the carotid
bifurcation and proximal ICA.

Vertebral arteries: Right vertebral artery slightly dominant. Mild
nonstenotic calcified plaque at the origin of the right vertebral
artery. Mild atheromatous narrowing at the origin of the left
vertebral artery.

Skeleton: Cervical spondylosis. No acute bony abnormality or
aggressive osseous lesion.

Other neck: No neck mass or cervical lymphadenopathy. Thyroid
unremarkable.

Upper chest: No consolidation within the imaged lung apices.

Review of the MIP images confirms the above findings

CTA HEAD FINDINGS

Anterior circulation:

The intracranial internal carotid arteries are patent. The M1 middle
cerebral arteries are patent. No M2 proximal branch occlusion or
high-grade proximal stenosis is identified. The A1 left anterior
cerebral artery is developmentally hypoplastic or absent. The
anterior cerebral arteries are otherwise patent. No intracranial
aneurysm is identified.

Posterior circulation:

The intracranial vertebral arteries are patent. The basilar artery
is patent. The posterior cerebral arteries are patent.
Atherosclerotic irregularity of the posterior cerebral arteries
bilaterally. Most notably, there is a severe stenosis within the
proximal left PCA at the P1/P2 junction (series 15, image 53).
Posterior communicating arteries are hypoplastic or absent
bilaterally.

Venous sinuses: Within the limitations of contrast timing, no
convincing thrombus.

Anatomic variants: Posterior communicating arteries are hypoplastic
or absent bilaterally.

Review of the MIP images confirms the above findings
IMPRESSION: CT head:

1. Small acute cortically-based infarct questioned within the mid to
posterior right frontal lobe (measuring approximately 10 mm).
Consider brain MRI for further evaluation.
2. Mild cerebral atrophy and chronic small vessel ischemic disease.

CTA neck:

1. The common carotid and internal carotid arteries are patent
within the neck without hemodynamically significant stenosis. Mild
atherosclerotic plaque within the carotid bifurcations and proximal
ICAs, bilaterally.
2. Vertebral arteries patent within the neck. Mild atherosclerotic
narrowing at the origin of both vessels.

CTA head:

1. No intracranial large vessel occlusion.
2. Severe stenosis within the proximal left posterior cerebral
artery at the P1/P2 junction.
3. Calcified plaque within the intracranial internal carotid
arteries with sites of mild stenosis, bilaterally.

## 2022-06-08 IMAGING — CT CT ANGIO HEAD
2 of 13 series · 4 of 35 positions shown · IV contrast (omnipaque)
Comparison: No pertinent prior exams available for comparison.

CLINICAL DATA: Neuro deficit, acute, stroke suspected. Additional
history provided: Patient reports left arm numbness from elbow to
left fingers starting this morning, blurred vision, patient's
daughter reports unstable gait, unknown last normal.

EXAM:
CT ANGIOGRAPHY HEAD AND NECK
TECHNIQUE: Multidetector CT imaging of the head and neck was performed using
the standard protocol during bolus administration of intravenous
contrast. Multiplanar CT image reconstructions and MIPs were
obtained to evaluate the vascular anatomy. Carotid stenosis
measurements (when applicable) are obtained utilizing NASCET
criteria, using the distal internal carotid diameter as the
denominator.
CONTRAST:  100mL OMNIPAQUE IOHEXOL 350 MG/ML SOLN

[Series 514: axial thin · axial · 0.48mm/px · z∈[-550,-194]mm · 3 of 357 slices shown]
[im 1/357  soft-tissue]
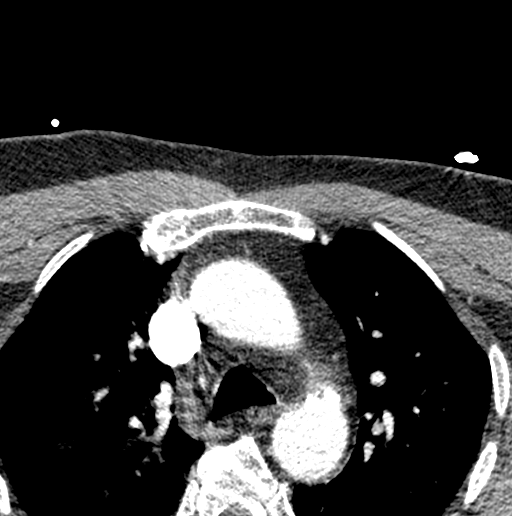
[im 179/357  bone]
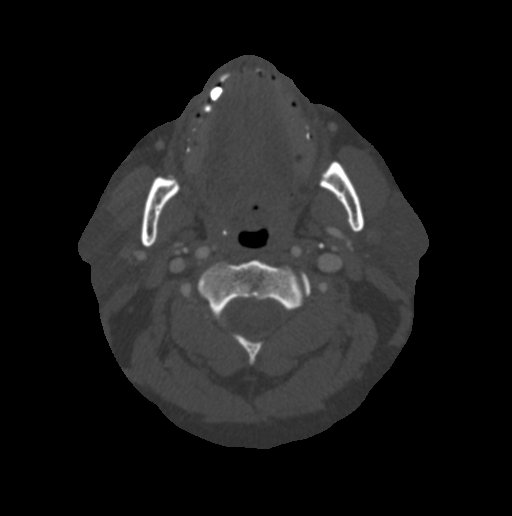
[im 357/357  soft-tissue]
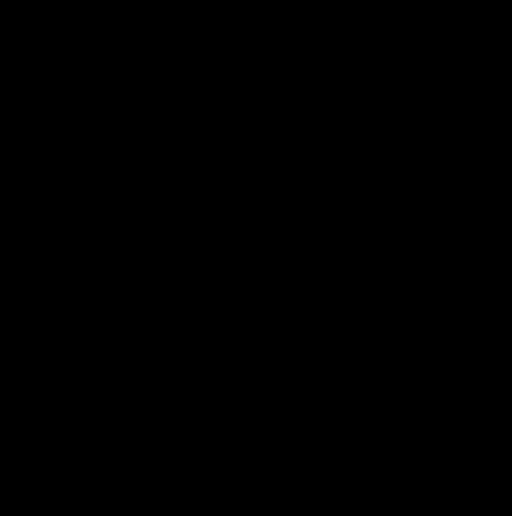

[Series 516: sagittal thin · sagittal · 0.62mm/px · 1 of 260 slices shown]
[im 103/260  soft-tissue]
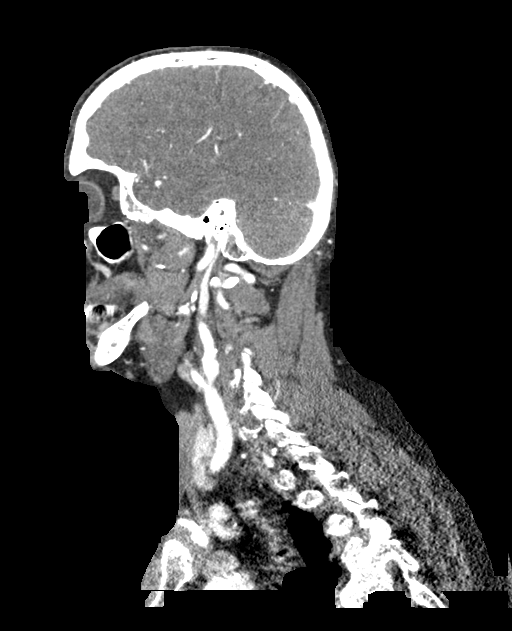

[4 of 35 positions shown; findings below may reference images not displayed]

FINDINGS: CT HEAD FINDINGS

Brain:

Mild cerebral atrophy.

Mild ill-defined hypoattenuation within the cerebral white matter is
nonspecific, but compatible with chronic small vessel ischemic
disease.

A small acute cortical/subcortical infarct (measuring approximately
10 mm) is questioned within the mid to posterior right frontal lobe
(for instance as seen on series 4, image 25) (series 8, images 49
and 50).

There is no acute intracranial hemorrhage.

No extra-axial fluid collection.

No evidence of intracranial mass.

No midline shift.

Vascular: No hyperdense vessel.  Atherosclerotic calcifications

Skull: Normal. Negative for fracture or focal lesion.

Sinuses: No significant paranasal sinus disease at the imaged
levels.

Orbits: No mass or acute finding

Review of the MIP images confirms the above findings

CTA NECK FINDINGS

Aortic arch: Standard aortic branching. Atherosclerotic plaque
within the visualized aortic arch and proximal major branch vessels
of the neck. No hemodynamically significant innominate or proximal
subclavian artery stenosis.

Right carotid system: CCA and ICA patent within the neck. Mild soft
and calcified plaque within the carotid bifurcation and proximal
ICA. Resultant less than 50% stenosis within the proximal ICA.
Tortuosity of the cervical ICA.

Left carotid system: CCA and ICA patent within the neck without
stenosis. Mild soft and calcified plaque within the carotid
bifurcation and proximal ICA.

Vertebral arteries: Right vertebral artery slightly dominant. Mild
nonstenotic calcified plaque at the origin of the right vertebral
artery. Mild atheromatous narrowing at the origin of the left
vertebral artery.

Skeleton: Cervical spondylosis. No acute bony abnormality or
aggressive osseous lesion.

Other neck: No neck mass or cervical lymphadenopathy. Thyroid
unremarkable.

Upper chest: No consolidation within the imaged lung apices.

Review of the MIP images confirms the above findings

CTA HEAD FINDINGS

Anterior circulation:

The intracranial internal carotid arteries are patent. The M1 middle
cerebral arteries are patent. No M2 proximal branch occlusion or
high-grade proximal stenosis is identified. The A1 left anterior
cerebral artery is developmentally hypoplastic or absent. The
anterior cerebral arteries are otherwise patent. No intracranial
aneurysm is identified.

Posterior circulation:

The intracranial vertebral arteries are patent. The basilar artery
is patent. The posterior cerebral arteries are patent.
Atherosclerotic irregularity of the posterior cerebral arteries
bilaterally. Most notably, there is a severe stenosis within the
proximal left PCA at the P1/P2 junction (series 15, image 53).
Posterior communicating arteries are hypoplastic or absent
bilaterally.

Venous sinuses: Within the limitations of contrast timing, no
convincing thrombus.

Anatomic variants: Posterior communicating arteries are hypoplastic
or absent bilaterally.

Review of the MIP images confirms the above findings
IMPRESSION: CT head:

1. Small acute cortically-based infarct questioned within the mid to
posterior right frontal lobe (measuring approximately 10 mm).
Consider brain MRI for further evaluation.
2. Mild cerebral atrophy and chronic small vessel ischemic disease.

CTA neck:

1. The common carotid and internal carotid arteries are patent
within the neck without hemodynamically significant stenosis. Mild
atherosclerotic plaque within the carotid bifurcations and proximal
ICAs, bilaterally.
2. Vertebral arteries patent within the neck. Mild atherosclerotic
narrowing at the origin of both vessels.

CTA head:

1. No intracranial large vessel occlusion.
2. Severe stenosis within the proximal left posterior cerebral
artery at the P1/P2 junction.
3. Calcified plaque within the intracranial internal carotid
arteries with sites of mild stenosis, bilaterally.

## 2022-06-09 NOTE — Progress Notes (Signed)
Carelink Summary Report / Loop Recorder 

## 2022-06-16 ENCOUNTER — Ambulatory Visit (INDEPENDENT_AMBULATORY_CARE_PROVIDER_SITE_OTHER): Payer: Medicare HMO

## 2022-06-16 DIAGNOSIS — I639 Cerebral infarction, unspecified: Secondary | ICD-10-CM | POA: Diagnosis not present

## 2022-06-16 LAB — CUP PACEART REMOTE DEVICE CHECK
Date Time Interrogation Session: 20240609231433
Implantable Pulse Generator Implant Date: 20220617

## 2022-06-22 NOTE — Progress Notes (Signed)
Noted patient has had follow up labs 

## 2022-07-08 NOTE — Progress Notes (Signed)
Carelink Summary Report / Loop Recorder 

## 2022-07-21 ENCOUNTER — Ambulatory Visit (INDEPENDENT_AMBULATORY_CARE_PROVIDER_SITE_OTHER): Payer: Medicare HMO

## 2022-07-21 DIAGNOSIS — I639 Cerebral infarction, unspecified: Secondary | ICD-10-CM | POA: Diagnosis not present

## 2022-07-22 LAB — CUP PACEART REMOTE DEVICE CHECK
Date Time Interrogation Session: 20240712231038
Implantable Pulse Generator Implant Date: 20220617

## 2022-08-04 NOTE — Progress Notes (Signed)
Carelink Summary Report / Loop Recorder 

## 2022-08-22 LAB — CUP PACEART REMOTE DEVICE CHECK
Date Time Interrogation Session: 20240816091811
Implantable Pulse Generator Implant Date: 20220617

## 2022-08-25 ENCOUNTER — Ambulatory Visit (INDEPENDENT_AMBULATORY_CARE_PROVIDER_SITE_OTHER): Payer: Medicare HMO

## 2022-08-25 DIAGNOSIS — I639 Cerebral infarction, unspecified: Secondary | ICD-10-CM | POA: Diagnosis not present

## 2022-09-04 NOTE — Progress Notes (Signed)
Carelink Summary Report / Loop Recorder 

## 2022-09-25 LAB — CUP PACEART REMOTE DEVICE CHECK
Date Time Interrogation Session: 20240918091820
Implantable Pulse Generator Implant Date: 20220617

## 2022-09-29 ENCOUNTER — Ambulatory Visit (INDEPENDENT_AMBULATORY_CARE_PROVIDER_SITE_OTHER): Payer: Medicare HMO

## 2022-09-29 DIAGNOSIS — I639 Cerebral infarction, unspecified: Secondary | ICD-10-CM

## 2022-10-10 NOTE — Progress Notes (Signed)
Carelink Summary Report / Loop Recorder 

## 2022-11-03 ENCOUNTER — Ambulatory Visit (INDEPENDENT_AMBULATORY_CARE_PROVIDER_SITE_OTHER): Payer: Medicare HMO

## 2022-11-03 DIAGNOSIS — I639 Cerebral infarction, unspecified: Secondary | ICD-10-CM | POA: Diagnosis not present

## 2022-11-04 LAB — CUP PACEART REMOTE DEVICE CHECK
Date Time Interrogation Session: 20241027230648
Implantable Pulse Generator Implant Date: 20220617

## 2022-11-21 NOTE — Progress Notes (Signed)
Carelink Summary Report / Loop Recorder 

## 2022-12-08 ENCOUNTER — Ambulatory Visit (INDEPENDENT_AMBULATORY_CARE_PROVIDER_SITE_OTHER): Payer: Medicare HMO

## 2022-12-08 DIAGNOSIS — I639 Cerebral infarction, unspecified: Secondary | ICD-10-CM | POA: Diagnosis not present

## 2022-12-08 LAB — CUP PACEART REMOTE DEVICE CHECK
Date Time Interrogation Session: 20241201231606
Implantable Pulse Generator Implant Date: 20220617

## 2023-01-12 ENCOUNTER — Ambulatory Visit (INDEPENDENT_AMBULATORY_CARE_PROVIDER_SITE_OTHER): Payer: Medicare Other

## 2023-01-12 DIAGNOSIS — I639 Cerebral infarction, unspecified: Secondary | ICD-10-CM

## 2023-01-12 LAB — CUP PACEART REMOTE DEVICE CHECK
Date Time Interrogation Session: 20250105232931
Implantable Pulse Generator Implant Date: 20220617

## 2023-02-16 ENCOUNTER — Ambulatory Visit (INDEPENDENT_AMBULATORY_CARE_PROVIDER_SITE_OTHER): Payer: Medicare HMO

## 2023-02-16 DIAGNOSIS — I639 Cerebral infarction, unspecified: Secondary | ICD-10-CM

## 2023-02-16 LAB — CUP PACEART REMOTE DEVICE CHECK
Date Time Interrogation Session: 20250209232121
Implantable Pulse Generator Implant Date: 20220617

## 2023-02-19 NOTE — Progress Notes (Signed)
Carelink Summary Report / Loop Recorder

## 2023-03-23 ENCOUNTER — Ambulatory Visit (INDEPENDENT_AMBULATORY_CARE_PROVIDER_SITE_OTHER): Payer: Medicare HMO

## 2023-03-23 DIAGNOSIS — I639 Cerebral infarction, unspecified: Secondary | ICD-10-CM | POA: Diagnosis not present

## 2023-03-24 LAB — CUP PACEART REMOTE DEVICE CHECK
Date Time Interrogation Session: 20250317002721
Implantable Pulse Generator Implant Date: 20220617

## 2023-03-24 NOTE — Progress Notes (Signed)
 [facility] Summary Report / Loop Recorder

## 2023-04-27 ENCOUNTER — Ambulatory Visit (INDEPENDENT_AMBULATORY_CARE_PROVIDER_SITE_OTHER): Payer: Medicare HMO

## 2023-04-27 DIAGNOSIS — I639 Cerebral infarction, unspecified: Secondary | ICD-10-CM

## 2023-04-28 LAB — CUP PACEART REMOTE DEVICE CHECK
Date Time Interrogation Session: 20250421002123
Implantable Pulse Generator Implant Date: 20220617

## 2023-05-07 NOTE — Progress Notes (Signed)
 [facility] Summary Report / Loop Recorder

## 2023-05-07 NOTE — Addendum Note (Signed)
 Addended by: Edra Govern D on: 05/07/2023 05:11 PM   Modules accepted: Orders

## 2023-05-28 ENCOUNTER — Ambulatory Visit (INDEPENDENT_AMBULATORY_CARE_PROVIDER_SITE_OTHER): Payer: Self-pay

## 2023-05-28 ENCOUNTER — Ambulatory Visit: Payer: Self-pay | Admitting: Cardiology

## 2023-05-28 DIAGNOSIS — I639 Cerebral infarction, unspecified: Secondary | ICD-10-CM | POA: Diagnosis not present

## 2023-05-28 LAB — CUP PACEART REMOTE DEVICE CHECK
Date Time Interrogation Session: 20250521230259
Implantable Pulse Generator Implant Date: 20220617

## 2023-06-15 NOTE — Addendum Note (Signed)
 Addended by: Edra Govern D on: 06/15/2023 09:57 AM   Modules accepted: Orders

## 2023-06-15 NOTE — Progress Notes (Signed)
 [facility] Summary Report / Loop Recorder

## 2023-06-29 ENCOUNTER — Ambulatory Visit (INDEPENDENT_AMBULATORY_CARE_PROVIDER_SITE_OTHER): Payer: Self-pay

## 2023-06-29 DIAGNOSIS — I639 Cerebral infarction, unspecified: Secondary | ICD-10-CM | POA: Diagnosis not present

## 2023-06-29 LAB — CUP PACEART REMOTE DEVICE CHECK
Date Time Interrogation Session: 20250622230536
Implantable Pulse Generator Implant Date: 20220617

## 2023-07-01 ENCOUNTER — Ambulatory Visit: Payer: Self-pay | Admitting: Cardiology

## 2023-07-15 NOTE — Progress Notes (Signed)
 [facility] Summary Report / Loop Recorder

## 2023-07-30 ENCOUNTER — Ambulatory Visit (INDEPENDENT_AMBULATORY_CARE_PROVIDER_SITE_OTHER): Payer: Self-pay

## 2023-07-30 DIAGNOSIS — I639 Cerebral infarction, unspecified: Secondary | ICD-10-CM

## 2023-07-30 LAB — CUP PACEART REMOTE DEVICE CHECK
Date Time Interrogation Session: 20250723230940
Implantable Pulse Generator Implant Date: 20220617

## 2023-08-03 ENCOUNTER — Ambulatory Visit: Payer: Self-pay | Admitting: Cardiology

## 2023-08-03 NOTE — Progress Notes (Signed)
 [facility] Summary Report / Loop Recorder

## 2023-08-31 ENCOUNTER — Ambulatory Visit (INDEPENDENT_AMBULATORY_CARE_PROVIDER_SITE_OTHER): Payer: Self-pay

## 2023-08-31 DIAGNOSIS — I639 Cerebral infarction, unspecified: Secondary | ICD-10-CM

## 2023-09-01 LAB — CUP PACEART REMOTE DEVICE CHECK
Date Time Interrogation Session: 20250823230847
Implantable Pulse Generator Implant Date: 20220617

## 2023-09-02 ENCOUNTER — Ambulatory Visit: Payer: Self-pay | Admitting: Cardiology

## 2023-09-23 NOTE — Progress Notes (Signed)
 Remote Loop Recorder Transmission

## 2023-10-01 ENCOUNTER — Ambulatory Visit: Payer: Self-pay

## 2023-10-01 DIAGNOSIS — I639 Cerebral infarction, unspecified: Secondary | ICD-10-CM

## 2023-10-05 LAB — CUP PACEART REMOTE DEVICE CHECK
Date Time Interrogation Session: 20250924230515
Implantable Pulse Generator Implant Date: 20220617

## 2023-10-05 NOTE — Progress Notes (Signed)
 Remote Loop Recorder Transmission

## 2023-10-06 LAB — CUP PACEART REMOTE DEVICE CHECK
Date Time Interrogation Session: 20250924230515
Implantable Pulse Generator Implant Date: 20220617

## 2023-10-06 NOTE — Progress Notes (Signed)
 Remote Loop Recorder Transmission

## 2023-10-07 ENCOUNTER — Ambulatory Visit: Payer: Self-pay | Admitting: Cardiology

## 2023-10-08 NOTE — Progress Notes (Signed)
 Remote Loop Recorder Transmission

## 2023-10-29 ENCOUNTER — Telehealth: Payer: Self-pay

## 2023-10-29 NOTE — Telephone Encounter (Addendum)
 Alert remote transmission: RRT reached 10/28/23     ILR @ RRT   ILR reached RRT:  10/28/23 Patient called, discussed options to leave device in or explanted.  Patient would like to have his device explanted.  Will forward to scheduling team to get in touch with the daughter who speaks english to make the explant appointment.   Marked I in Paceart: Yes Enter note in Paceart: Yes Canceled future remotes: Yes Discontinued from website:  Yes Entered in Speciality Comments:  Yes  Advised if further questions arise to please call the device clinic at [phone].

## 2023-10-30 NOTE — Telephone Encounter (Signed)
 Spoke w/ patients daughter - patient is scheduled for loop explant on 12/3 with Dr. Inocencio.

## 2023-12-09 ENCOUNTER — Ambulatory Visit: Admitting: Cardiology

## 2024-01-10 ENCOUNTER — Encounter (HOSPITAL_BASED_OUTPATIENT_CLINIC_OR_DEPARTMENT_OTHER): Payer: Self-pay

## 2024-01-10 ENCOUNTER — Emergency Department (HOSPITAL_BASED_OUTPATIENT_CLINIC_OR_DEPARTMENT_OTHER)

## 2024-01-10 ENCOUNTER — Emergency Department (HOSPITAL_BASED_OUTPATIENT_CLINIC_OR_DEPARTMENT_OTHER)
Admission: EM | Admit: 2024-01-10 | Discharge: 2024-01-10 | Disposition: A | Discharge status: Home / Self Care | Source: Home / Self Care | Attending: Emergency Medicine | Admitting: Emergency Medicine

## 2024-01-10 ENCOUNTER — Other Ambulatory Visit: Payer: Self-pay

## 2024-01-10 DIAGNOSIS — N3001 Acute cystitis with hematuria: Secondary | ICD-10-CM | POA: Diagnosis not present

## 2024-01-10 DIAGNOSIS — R350 Frequency of micturition: Secondary | ICD-10-CM | POA: Diagnosis present

## 2024-01-10 LAB — URINALYSIS, ROUTINE W REFLEX MICROSCOPIC

## 2024-01-10 LAB — CBC WITH DIFFERENTIAL/PLATELET
Abs Immature Granulocytes: 0.13 K/uL — ABNORMAL HIGH (ref 0.00–0.07)
Basophils Absolute: 0.1 K/uL (ref 0.0–0.1)
Basophils Relative: 0 %
Eosinophils Absolute: 0.1 K/uL (ref 0.0–0.5)
Eosinophils Relative: 0 %
HCT: 41.9 % (ref 39.0–52.0)
Hemoglobin: 14.4 g/dL (ref 13.0–17.0)
Immature Granulocytes: 1 %
Lymphocytes Relative: 12 %
Lymphs Abs: 2.5 K/uL (ref 0.7–4.0)
MCH: 30.3 pg (ref 26.0–34.0)
MCHC: 34.4 g/dL (ref 30.0–36.0)
MCV: 88.2 fL (ref 80.0–100.0)
Monocytes Absolute: 1.9 K/uL — ABNORMAL HIGH (ref 0.1–1.0)
Monocytes Relative: 9 %
Neutro Abs: 16.4 K/uL — ABNORMAL HIGH (ref 1.7–7.7)
Neutrophils Relative %: 78 %
Platelets: 193 K/uL (ref 150–400)
RBC: 4.75 MIL/uL (ref 4.22–5.81)
RDW: 12.7 % (ref 11.5–15.5)
WBC: 21 K/uL — ABNORMAL HIGH (ref 4.0–10.5)
nRBC: 0 % (ref 0.0–0.2)

## 2024-01-10 LAB — COMPREHENSIVE METABOLIC PANEL WITH GFR
ALT: 23 U/L (ref 0–44)
AST: 16 U/L (ref 15–41)
Albumin: 4.6 g/dL (ref 3.5–5.0)
Alkaline Phosphatase: 70 U/L (ref 38–126)
Anion gap: 11 (ref 5–15)
BUN: 12 mg/dL (ref 8–23)
CO2: 28 mmol/L (ref 22–32)
Calcium: 9.2 mg/dL (ref 8.9–10.3)
Chloride: 100 mmol/L (ref 98–111)
Creatinine, Ser: 0.79 mg/dL (ref 0.61–1.24)
GFR, Estimated: >60 mL/min
Glucose, Bld: 124 mg/dL — ABNORMAL HIGH (ref 70–99)
Potassium: 3.9 mmol/L (ref 3.5–5.1)
Sodium: 139 mmol/L (ref 135–145)
Total Bilirubin: 1.9 mg/dL — ABNORMAL HIGH (ref 0.0–1.2)
Total Protein: 7.2 g/dL (ref 6.5–8.1)

## 2024-01-10 LAB — URINALYSIS, MICROSCOPIC (REFLEX)
RBC / HPF: >50 RBC/hpf (ref 0–5)
WBC, UA: >50 WBC/hpf (ref 0–5)

## 2024-01-10 LAB — CBG MONITORING, ED: Glucose-Capillary: 116 mg/dL — ABNORMAL HIGH (ref 70–99)

## 2024-01-10 MED ORDER — CEPHALEXIN 500 MG PO CAPS: 500.0000 mg | ORAL_CAPSULE | Freq: Four times a day (QID) | ORAL | 0 refills | Status: AC

## 2024-01-10 MED ORDER — SODIUM CHLORIDE 0.9 % IV SOLN
1.0000 g | Freq: Once | INTRAVENOUS | Status: AC
  Administered 2024-01-10: 1 g via INTRAVENOUS
  Filled 2024-01-10: qty 10

## 2024-01-10 NOTE — ED Triage Notes (Signed)
 Per pt daughter, pt having increased urinary frequency. Pt reports urine is slightly dark.

## 2024-01-10 NOTE — ED Provider Notes (Signed)
 " [place] [facility] Provider Note   CSN: [id number] Arrival date & time: 01/10/24  1707     Patient presents with: Urinary Frequency   Randy Robertson is a 74 y.o. male.   74 year old male here today for dark-colored urine which occurred today.  He denies any pain fever or chills.  Here today with his daughter at bedside.   Urinary Frequency       Prior to Admission medications  Medication Sig Start Date End Date Taking? Authorizing Provider  cephALEXin  (KEFLEX ) 500 MG capsule Take 1 capsule (500 mg total) by mouth 4 (four) times daily. 01/10/24  Yes Mannie Pac T, DO  amLODipine  (NORVASC ) 5 MG tablet Take 1 tablet by mouth daily. 07/26/20   [provider]  atorvastatin  (LIPITOR) 40 MG tablet Take 1 tablet (40 mg total) by mouth daily. 06/24/21 09/22/21  Whitfield Raisin, NP  atorvastatin  (LIPITOR) 40 MG tablet Take 1 tablet (40 mg total) by mouth daily. 08/12/21   Whitfield Raisin, NP  clopidogrel  (PLAVIX ) 75 MG tablet Take 1 tablet (75 mg total) by mouth daily. 06/24/21   Whitfield Raisin, NP  vitamin B-12 (CYANOCOBALAMIN) 1000 MCG tablet Take 1,000 mcg by mouth daily.    [provider]    Allergies: Patient has no known allergies.    Review of Systems  Genitourinary:  Positive for frequency.    Updated Vital Signs BP (!) 150/77 (BP Location: Right Arm)   Pulse 84   Temp 97.8 F (36.6 C)   Resp 18   Wt 114.3 kg   SpO2 94%   BMI 35.15 kg/m   Physical Exam Vitals and nursing note reviewed.  Constitutional:      Appearance: Normal appearance.  Eyes:     Conjunctiva/sclera: Conjunctivae normal.  Cardiovascular:     Rate and Rhythm: Normal rate.  Pulmonary:     Effort: Pulmonary effort is normal.  Abdominal:     General: Abdomen is flat. There is no distension.     Palpations: Abdomen is soft.     Tenderness: There is no abdominal tenderness. There is no right CVA tenderness, left CVA tenderness or guarding.   Musculoskeletal:        General: Normal range of motion.  Skin:    General: Skin is warm.  Neurological:     Mental Status: He is alert.     (all labs ordered are listed, but only abnormal results are displayed) Labs Reviewed  URINALYSIS, ROUTINE W REFLEX MICROSCOPIC - Abnormal; Notable for the following components:      Result Value   Color, Urine AMBER (*)    APPearance TURBID (*)    Glucose, UA   (*)    Value: TEST NOT REPORTED DUE TO COLOR INTERFERENCE OF URINE PIGMENT   Hgb urine dipstick   (*)    Value: TEST NOT REPORTED DUE TO COLOR INTERFERENCE OF URINE PIGMENT   Bilirubin Urine   (*)    Value: TEST NOT REPORTED DUE TO COLOR INTERFERENCE OF URINE PIGMENT   Ketones, ur   (*)    Value: TEST NOT REPORTED DUE TO COLOR INTERFERENCE OF URINE PIGMENT   Protein, ur   (*)    Value: TEST NOT REPORTED DUE TO COLOR INTERFERENCE OF URINE PIGMENT   Nitrite   (*)    Value: TEST NOT REPORTED DUE TO COLOR INTERFERENCE OF URINE PIGMENT   Leukocytes,Ua   (*)    Value: TEST NOT REPORTED DUE TO COLOR  INTERFERENCE OF URINE PIGMENT   All other components within normal limits  CBC WITH DIFFERENTIAL/PLATELET - Abnormal; Notable for the following components:   WBC 21.0 (*)    Neutro Abs 16.4 (*)    Monocytes Absolute 1.9 (*)    Abs Immature Granulocytes 0.13 (*)    All other components within normal limits  COMPREHENSIVE METABOLIC PANEL WITH GFR - Abnormal; Notable for the following components:   Glucose, Bld 124 (*)    Total Bilirubin 1.9 (*)    All other components within normal limits  URINALYSIS, MICROSCOPIC (REFLEX) - Abnormal; Notable for the following components:   Bacteria, UA FEW (*)    Non Squamous Epithelial PRESENT (*)    All other components within normal limits  CBG MONITORING, ED - Abnormal; Notable for the following components:   Glucose-Capillary 116 (*)    All other components within normal limits    EKG: None  Radiology: CT ABDOMEN PELVIS WO CONTRAST Result  Date: 01/10/2024 CLINICAL DATA:  Flank pain dark urine EXAM: CT ABDOMEN AND PELVIS WITHOUT CONTRAST TECHNIQUE: Multidetector CT imaging of the abdomen and pelvis was performed following the standard protocol without IV contrast. RADIATION DOSE REDUCTION: This exam was performed according to the departmental dose-optimization program which includes automated exposure control, adjustment of the mA and/or kV according to patient size and/or use of iterative reconstruction technique. COMPARISON:  None Available. FINDINGS: Lower chest: Lung bases demonstrate no acute airspace disease. Calcified granuloma in the left lung base. Mild coronary vascular calcification Hepatobiliary: No focal liver abnormality is seen. No gallstones, gallbladder wall thickening, or biliary dilatation. Pancreas: Unremarkable. No pancreatic ductal dilatation or surrounding inflammatory changes. Spleen: Normal in size without focal abnormality. Adrenals/Urinary Tract: Adrenal glands are normal. Suspicion of parapelvic cysts, no specific imaging follow-up recommended. Nonspecific perinephric stranding. No hydronephrosis. No obstructing stone. Thick-walled urinary bladder with perivesical stranding Stomach/Bowel: Stomach within normal limits. No dilated small bowel. No acute bowel wall thickening. Diverticular disease of the left colon Vascular/Lymphatic: Aortic atherosclerosis. No enlarged abdominal or pelvic lymph nodes. Negative for aortic aneurysm. 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 Reproductive: Enlarged prostate Other: No ascites or free air Musculoskeletal: No acute or suspicious osseous abnormality. IMPRESSION: 1. Negative for hydronephrosis or ureteral stone. 2. Thick-walled urinary bladder with perivesical stranding, suspicious for cystitis 3. Enlarged prostate 4. Diverticular disease of the left colon without acute inflammatory process. 5. Aortic atherosclerosis. Aortic Atherosclerosis  (ICD10-I70.0). Electronically Signed   By: Luke Bun M.D.   On: 01/10/2024 19:03     Procedures   Medications Ordered in the ED  cefTRIAXone  (ROCEPHIN ) 1 g in sodium chloride  0.9 % 100 mL IVPB (has no administration in time range)                                    Medical Decision Making 74 year old male here today with dark urine.  Differential diagnose include UTI, prostatitis, nephrolithiasis.  Plan-urinalysis difficult to interpret due to its turbidity.  Will obtain imaging of the patient's abdomen and pelvis.  Blood work ordered.  Patient overall looks quite well.  Reassessment 7:15 PM-patient with a leukocytosis, his CT scan consistent with cystitis.  He looks well, is tolerating p.o.  Do not believe he requires admission although it was considered.  I have right him with a dose of Rocephin  here.  Will discharge with Keflex .  Discussed with patient  patient's daughter at bedside.  They are agreeable with plan.    Amount and/or Complexity of Data Reviewed Labs: ordered. Radiology: ordered.        Final diagnoses:  Acute cystitis with hematuria    ED Discharge Orders          Ordered    cephALEXin  (KEFLEX ) 500 MG capsule  4 times daily        01/10/24 1918               Mannie Pac T, DO 01/10/24 1918  "

## 2024-01-10 NOTE — Discharge Instructions (Signed)
 You have an infection in your urine.  You received an antibiotic here in the emergency room.  Tomorrow, you may begin taking Keflex  4 times per day for the next 1 week.  Return to the emergency room if you develop fever, pain in your sides, inability eat or drink or feel as though you are getting worse.  Follow-up with your primary care doctor within 2 to 3 days.

## 2024-02-17 ENCOUNTER — Ambulatory Visit: Admitting: Cardiology
